# Patient Record
Sex: Female | Born: 1935 | Race: White | Hispanic: No | Smoking: Former smoker
Health system: Southern US, Community
[De-identification: ages and names within clinical notes are randomized; demographics above are authoritative.]

## PROBLEM LIST (undated history)

## (undated) DIAGNOSIS — F0280 Dementia in other diseases classified elsewhere without behavioral disturbance: Secondary | ICD-10-CM

## (undated) DIAGNOSIS — M545 Low back pain, unspecified: Secondary | ICD-10-CM

## (undated) DIAGNOSIS — E785 Hyperlipidemia, unspecified: Secondary | ICD-10-CM

## (undated) DIAGNOSIS — F329 Major depressive disorder, single episode, unspecified: Secondary | ICD-10-CM

## (undated) DIAGNOSIS — E7800 Pure hypercholesterolemia, unspecified: Secondary | ICD-10-CM

## (undated) DIAGNOSIS — G4733 Obstructive sleep apnea (adult) (pediatric): Secondary | ICD-10-CM

## (undated) DIAGNOSIS — R42 Dizziness and giddiness: Secondary | ICD-10-CM

## (undated) DIAGNOSIS — R001 Bradycardia, unspecified: Secondary | ICD-10-CM

## (undated) DIAGNOSIS — F3289 Other specified depressive episodes: Secondary | ICD-10-CM

## (undated) DIAGNOSIS — M4800 Spinal stenosis, site unspecified: Secondary | ICD-10-CM

## (undated) DIAGNOSIS — C50919 Malignant neoplasm of unspecified site of unspecified female breast: Secondary | ICD-10-CM

## (undated) DIAGNOSIS — M129 Arthropathy, unspecified: Secondary | ICD-10-CM

## (undated) DIAGNOSIS — G2581 Restless legs syndrome: Secondary | ICD-10-CM

## (undated) DIAGNOSIS — M792 Neuralgia and neuritis, unspecified: Secondary | ICD-10-CM

## (undated) DIAGNOSIS — J342 Deviated nasal septum: Secondary | ICD-10-CM

## (undated) DIAGNOSIS — G309 Alzheimer's disease, unspecified: Secondary | ICD-10-CM

## (undated) DIAGNOSIS — I10 Essential (primary) hypertension: Secondary | ICD-10-CM

## (undated) HISTORY — DX: Other specified depressive episodes: F32.89

## (undated) HISTORY — DX: Deviated nasal septum: J34.2

## (undated) HISTORY — DX: Malignant neoplasm of unspecified site of unspecified female breast: C50.919

## (undated) HISTORY — DX: Dizziness and giddiness: R42

## (undated) HISTORY — DX: Pure hypercholesterolemia, unspecified: E78.00

## (undated) HISTORY — PX: CHOLECYSTECTOMY: SHX55

## (undated) HISTORY — DX: Obstructive sleep apnea (adult) (pediatric): G47.33

## (undated) HISTORY — PX: BREAST BIOPSY: SHX20

## (undated) HISTORY — PX: KNEE SURGERY: SHX244

## (undated) HISTORY — DX: Bradycardia, unspecified: R00.1

## (undated) HISTORY — DX: Spinal stenosis, site unspecified: M48.00

## (undated) HISTORY — DX: Hyperlipidemia, unspecified: E78.5

## (undated) HISTORY — DX: Major depressive disorder, single episode, unspecified: F32.9

---
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
## 1961-11-26 HISTORY — PX: APPENDECTOMY: SHX54

## 1979-11-27 HISTORY — PX: ABDOMINAL HYSTERECTOMY: SHX81

## 1995-11-27 HISTORY — PX: BREAST LUMPECTOMY: SHX2

## 1996-11-26 HISTORY — PX: CARPAL TUNNEL RELEASE: SHX101

## 2000-11-26 HISTORY — PX: TOTAL HIP ARTHROPLASTY: SHX124

## 2000-11-26 HISTORY — PX: PARTIAL COLECTOMY: SHX5273

## 2006-04-26 HISTORY — PX: TOTAL HIP ARTHROPLASTY: SHX124

## 2007-11-27 HISTORY — PX: PACEMAKER INSERTION: SHX728

## 2009-03-22 HISTORY — PX: COLONOSCOPY: SHX174

## 2010-01-24 HISTORY — PX: OPEN REDUCTION NASAL FRACTURE: SHX2105

## 2012-01-17 DIAGNOSIS — C50919 Malignant neoplasm of unspecified site of unspecified female breast: Secondary | ICD-10-CM | POA: Diagnosis not present

## 2012-01-17 DIAGNOSIS — E7800 Pure hypercholesterolemia, unspecified: Secondary | ICD-10-CM | POA: Diagnosis not present

## 2012-01-17 DIAGNOSIS — R300 Dysuria: Secondary | ICD-10-CM | POA: Diagnosis not present

## 2012-01-17 DIAGNOSIS — E559 Vitamin D deficiency, unspecified: Secondary | ICD-10-CM | POA: Diagnosis not present

## 2012-01-17 DIAGNOSIS — I10 Essential (primary) hypertension: Secondary | ICD-10-CM | POA: Diagnosis not present

## 2012-01-17 DIAGNOSIS — R319 Hematuria, unspecified: Secondary | ICD-10-CM | POA: Diagnosis not present

## 2012-01-17 DIAGNOSIS — M810 Age-related osteoporosis without current pathological fracture: Secondary | ICD-10-CM | POA: Diagnosis not present

## 2012-02-18 DIAGNOSIS — J309 Allergic rhinitis, unspecified: Secondary | ICD-10-CM | POA: Diagnosis not present

## 2012-05-15 DIAGNOSIS — R233 Spontaneous ecchymoses: Secondary | ICD-10-CM | POA: Diagnosis not present

## 2012-05-15 DIAGNOSIS — I10 Essential (primary) hypertension: Secondary | ICD-10-CM | POA: Diagnosis not present

## 2012-05-24 DIAGNOSIS — R42 Dizziness and giddiness: Secondary | ICD-10-CM | POA: Diagnosis not present

## 2012-09-03 DIAGNOSIS — E7800 Pure hypercholesterolemia, unspecified: Secondary | ICD-10-CM | POA: Insufficient documentation

## 2012-09-03 DIAGNOSIS — C50919 Malignant neoplasm of unspecified site of unspecified female breast: Secondary | ICD-10-CM | POA: Insufficient documentation

## 2012-09-10 DIAGNOSIS — IMO0002 Reserved for concepts with insufficient information to code with codable children: Secondary | ICD-10-CM | POA: Diagnosis not present

## 2012-09-10 DIAGNOSIS — R413 Other amnesia: Secondary | ICD-10-CM | POA: Diagnosis not present

## 2012-09-10 DIAGNOSIS — G4733 Obstructive sleep apnea (adult) (pediatric): Secondary | ICD-10-CM | POA: Diagnosis not present

## 2012-09-10 DIAGNOSIS — R42 Dizziness and giddiness: Secondary | ICD-10-CM | POA: Insufficient documentation

## 2012-09-10 DIAGNOSIS — I10 Essential (primary) hypertension: Secondary | ICD-10-CM | POA: Diagnosis not present

## 2012-09-10 DIAGNOSIS — E785 Hyperlipidemia, unspecified: Secondary | ICD-10-CM | POA: Diagnosis not present

## 2012-09-29 DIAGNOSIS — R413 Other amnesia: Secondary | ICD-10-CM | POA: Diagnosis not present

## 2012-09-29 DIAGNOSIS — R42 Dizziness and giddiness: Secondary | ICD-10-CM | POA: Diagnosis not present

## 2012-09-29 DIAGNOSIS — H81399 Other peripheral vertigo, unspecified ear: Secondary | ICD-10-CM | POA: Diagnosis not present

## 2012-09-29 DIAGNOSIS — R269 Unspecified abnormalities of gait and mobility: Secondary | ICD-10-CM | POA: Diagnosis not present

## 2012-09-29 DIAGNOSIS — R41841 Cognitive communication deficit: Secondary | ICD-10-CM | POA: Diagnosis not present

## 2012-09-29 DIAGNOSIS — R279 Unspecified lack of coordination: Secondary | ICD-10-CM | POA: Diagnosis not present

## 2012-09-30 DIAGNOSIS — R413 Other amnesia: Secondary | ICD-10-CM | POA: Diagnosis not present

## 2012-09-30 DIAGNOSIS — R41841 Cognitive communication deficit: Secondary | ICD-10-CM | POA: Diagnosis not present

## 2012-09-30 DIAGNOSIS — H81399 Other peripheral vertigo, unspecified ear: Secondary | ICD-10-CM | POA: Diagnosis not present

## 2012-09-30 DIAGNOSIS — R42 Dizziness and giddiness: Secondary | ICD-10-CM | POA: Diagnosis not present

## 2012-09-30 DIAGNOSIS — R279 Unspecified lack of coordination: Secondary | ICD-10-CM | POA: Diagnosis not present

## 2012-09-30 DIAGNOSIS — R269 Unspecified abnormalities of gait and mobility: Secondary | ICD-10-CM | POA: Diagnosis not present

## 2012-10-01 DIAGNOSIS — H81399 Other peripheral vertigo, unspecified ear: Secondary | ICD-10-CM | POA: Diagnosis not present

## 2012-10-01 DIAGNOSIS — R41841 Cognitive communication deficit: Secondary | ICD-10-CM | POA: Diagnosis not present

## 2012-10-01 DIAGNOSIS — R42 Dizziness and giddiness: Secondary | ICD-10-CM | POA: Diagnosis not present

## 2012-10-01 DIAGNOSIS — R279 Unspecified lack of coordination: Secondary | ICD-10-CM | POA: Diagnosis not present

## 2012-10-01 DIAGNOSIS — R413 Other amnesia: Secondary | ICD-10-CM | POA: Diagnosis not present

## 2012-10-01 DIAGNOSIS — R269 Unspecified abnormalities of gait and mobility: Secondary | ICD-10-CM | POA: Diagnosis not present

## 2012-10-06 DIAGNOSIS — R413 Other amnesia: Secondary | ICD-10-CM | POA: Diagnosis not present

## 2012-10-06 DIAGNOSIS — R42 Dizziness and giddiness: Secondary | ICD-10-CM | POA: Diagnosis not present

## 2012-10-06 DIAGNOSIS — R269 Unspecified abnormalities of gait and mobility: Secondary | ICD-10-CM | POA: Diagnosis not present

## 2012-10-06 DIAGNOSIS — R41841 Cognitive communication deficit: Secondary | ICD-10-CM | POA: Diagnosis not present

## 2012-10-06 DIAGNOSIS — R279 Unspecified lack of coordination: Secondary | ICD-10-CM | POA: Diagnosis not present

## 2012-10-06 DIAGNOSIS — H81399 Other peripheral vertigo, unspecified ear: Secondary | ICD-10-CM | POA: Diagnosis not present

## 2012-10-07 DIAGNOSIS — M79609 Pain in unspecified limb: Secondary | ICD-10-CM | POA: Diagnosis not present

## 2012-10-07 DIAGNOSIS — B351 Tinea unguium: Secondary | ICD-10-CM | POA: Diagnosis not present

## 2012-10-07 DIAGNOSIS — M2010 Hallux valgus (acquired), unspecified foot: Secondary | ICD-10-CM | POA: Diagnosis not present

## 2012-10-07 DIAGNOSIS — L851 Acquired keratosis [keratoderma] palmaris et plantaris: Secondary | ICD-10-CM | POA: Diagnosis not present

## 2012-10-08 DIAGNOSIS — R413 Other amnesia: Secondary | ICD-10-CM | POA: Diagnosis not present

## 2012-10-08 DIAGNOSIS — R279 Unspecified lack of coordination: Secondary | ICD-10-CM | POA: Diagnosis not present

## 2012-10-08 DIAGNOSIS — R41841 Cognitive communication deficit: Secondary | ICD-10-CM | POA: Diagnosis not present

## 2012-10-08 DIAGNOSIS — Z23 Encounter for immunization: Secondary | ICD-10-CM | POA: Diagnosis not present

## 2012-10-08 DIAGNOSIS — R42 Dizziness and giddiness: Secondary | ICD-10-CM | POA: Diagnosis not present

## 2012-10-08 DIAGNOSIS — H81399 Other peripheral vertigo, unspecified ear: Secondary | ICD-10-CM | POA: Diagnosis not present

## 2012-10-08 DIAGNOSIS — R269 Unspecified abnormalities of gait and mobility: Secondary | ICD-10-CM | POA: Diagnosis not present

## 2012-10-09 DIAGNOSIS — H81399 Other peripheral vertigo, unspecified ear: Secondary | ICD-10-CM | POA: Diagnosis not present

## 2012-10-09 DIAGNOSIS — R41841 Cognitive communication deficit: Secondary | ICD-10-CM | POA: Diagnosis not present

## 2012-10-09 DIAGNOSIS — R42 Dizziness and giddiness: Secondary | ICD-10-CM | POA: Diagnosis not present

## 2012-10-09 DIAGNOSIS — R413 Other amnesia: Secondary | ICD-10-CM | POA: Diagnosis not present

## 2012-10-09 DIAGNOSIS — R279 Unspecified lack of coordination: Secondary | ICD-10-CM | POA: Diagnosis not present

## 2012-10-09 DIAGNOSIS — R269 Unspecified abnormalities of gait and mobility: Secondary | ICD-10-CM | POA: Diagnosis not present

## 2012-10-13 DIAGNOSIS — R29898 Other symptoms and signs involving the musculoskeletal system: Secondary | ICD-10-CM | POA: Diagnosis not present

## 2012-10-13 DIAGNOSIS — R279 Unspecified lack of coordination: Secondary | ICD-10-CM | POA: Diagnosis not present

## 2012-10-13 DIAGNOSIS — R269 Unspecified abnormalities of gait and mobility: Secondary | ICD-10-CM | POA: Diagnosis not present

## 2012-10-13 DIAGNOSIS — M4800 Spinal stenosis, site unspecified: Secondary | ICD-10-CM | POA: Diagnosis not present

## 2012-10-13 DIAGNOSIS — R42 Dizziness and giddiness: Secondary | ICD-10-CM | POA: Diagnosis not present

## 2012-10-13 DIAGNOSIS — G319 Degenerative disease of nervous system, unspecified: Secondary | ICD-10-CM | POA: Diagnosis not present

## 2012-10-13 DIAGNOSIS — R413 Other amnesia: Secondary | ICD-10-CM | POA: Diagnosis not present

## 2012-10-13 DIAGNOSIS — R41841 Cognitive communication deficit: Secondary | ICD-10-CM | POA: Diagnosis not present

## 2012-10-13 DIAGNOSIS — M545 Low back pain: Secondary | ICD-10-CM | POA: Diagnosis not present

## 2012-10-13 DIAGNOSIS — H81399 Other peripheral vertigo, unspecified ear: Secondary | ICD-10-CM | POA: Diagnosis not present

## 2012-10-14 DIAGNOSIS — R42 Dizziness and giddiness: Secondary | ICD-10-CM | POA: Diagnosis not present

## 2012-10-14 DIAGNOSIS — R413 Other amnesia: Secondary | ICD-10-CM | POA: Diagnosis not present

## 2012-10-14 DIAGNOSIS — H81399 Other peripheral vertigo, unspecified ear: Secondary | ICD-10-CM | POA: Diagnosis not present

## 2012-10-14 DIAGNOSIS — R279 Unspecified lack of coordination: Secondary | ICD-10-CM | POA: Diagnosis not present

## 2012-10-14 DIAGNOSIS — R269 Unspecified abnormalities of gait and mobility: Secondary | ICD-10-CM | POA: Diagnosis not present

## 2012-10-14 DIAGNOSIS — R41841 Cognitive communication deficit: Secondary | ICD-10-CM | POA: Diagnosis not present

## 2012-10-15 DIAGNOSIS — R41841 Cognitive communication deficit: Secondary | ICD-10-CM | POA: Diagnosis not present

## 2012-10-15 DIAGNOSIS — R269 Unspecified abnormalities of gait and mobility: Secondary | ICD-10-CM | POA: Diagnosis not present

## 2012-10-15 DIAGNOSIS — H81399 Other peripheral vertigo, unspecified ear: Secondary | ICD-10-CM | POA: Diagnosis not present

## 2012-10-15 DIAGNOSIS — R279 Unspecified lack of coordination: Secondary | ICD-10-CM | POA: Diagnosis not present

## 2012-10-15 DIAGNOSIS — R413 Other amnesia: Secondary | ICD-10-CM | POA: Diagnosis not present

## 2012-10-15 DIAGNOSIS — R42 Dizziness and giddiness: Secondary | ICD-10-CM | POA: Diagnosis not present

## 2012-10-17 DIAGNOSIS — R413 Other amnesia: Secondary | ICD-10-CM | POA: Diagnosis not present

## 2012-10-17 DIAGNOSIS — R269 Unspecified abnormalities of gait and mobility: Secondary | ICD-10-CM | POA: Diagnosis not present

## 2012-10-17 DIAGNOSIS — R279 Unspecified lack of coordination: Secondary | ICD-10-CM | POA: Diagnosis not present

## 2012-10-17 DIAGNOSIS — R42 Dizziness and giddiness: Secondary | ICD-10-CM | POA: Diagnosis not present

## 2012-10-17 DIAGNOSIS — H81399 Other peripheral vertigo, unspecified ear: Secondary | ICD-10-CM | POA: Diagnosis not present

## 2012-10-17 DIAGNOSIS — R41841 Cognitive communication deficit: Secondary | ICD-10-CM | POA: Diagnosis not present

## 2012-10-19 DIAGNOSIS — R269 Unspecified abnormalities of gait and mobility: Secondary | ICD-10-CM | POA: Diagnosis not present

## 2012-10-19 DIAGNOSIS — R413 Other amnesia: Secondary | ICD-10-CM | POA: Diagnosis not present

## 2012-10-19 DIAGNOSIS — R42 Dizziness and giddiness: Secondary | ICD-10-CM | POA: Diagnosis not present

## 2012-10-19 DIAGNOSIS — H81399 Other peripheral vertigo, unspecified ear: Secondary | ICD-10-CM | POA: Diagnosis not present

## 2012-10-19 DIAGNOSIS — R41841 Cognitive communication deficit: Secondary | ICD-10-CM | POA: Diagnosis not present

## 2012-10-19 DIAGNOSIS — R279 Unspecified lack of coordination: Secondary | ICD-10-CM | POA: Diagnosis not present

## 2012-10-21 DIAGNOSIS — R42 Dizziness and giddiness: Secondary | ICD-10-CM | POA: Diagnosis not present

## 2012-10-21 DIAGNOSIS — R41841 Cognitive communication deficit: Secondary | ICD-10-CM | POA: Diagnosis not present

## 2012-10-21 DIAGNOSIS — R413 Other amnesia: Secondary | ICD-10-CM | POA: Diagnosis not present

## 2012-10-21 DIAGNOSIS — H81399 Other peripheral vertigo, unspecified ear: Secondary | ICD-10-CM | POA: Diagnosis not present

## 2012-10-21 DIAGNOSIS — R269 Unspecified abnormalities of gait and mobility: Secondary | ICD-10-CM | POA: Diagnosis not present

## 2012-10-21 DIAGNOSIS — R279 Unspecified lack of coordination: Secondary | ICD-10-CM | POA: Diagnosis not present

## 2012-10-27 DIAGNOSIS — R413 Other amnesia: Secondary | ICD-10-CM | POA: Diagnosis not present

## 2012-10-27 DIAGNOSIS — H81399 Other peripheral vertigo, unspecified ear: Secondary | ICD-10-CM | POA: Diagnosis not present

## 2012-10-27 DIAGNOSIS — R42 Dizziness and giddiness: Secondary | ICD-10-CM | POA: Diagnosis not present

## 2012-10-27 DIAGNOSIS — R279 Unspecified lack of coordination: Secondary | ICD-10-CM | POA: Diagnosis not present

## 2012-10-27 DIAGNOSIS — R41841 Cognitive communication deficit: Secondary | ICD-10-CM | POA: Diagnosis not present

## 2012-10-27 DIAGNOSIS — R269 Unspecified abnormalities of gait and mobility: Secondary | ICD-10-CM | POA: Diagnosis not present

## 2012-10-28 DIAGNOSIS — R41841 Cognitive communication deficit: Secondary | ICD-10-CM | POA: Diagnosis not present

## 2012-10-28 DIAGNOSIS — R413 Other amnesia: Secondary | ICD-10-CM | POA: Diagnosis not present

## 2012-10-28 DIAGNOSIS — R42 Dizziness and giddiness: Secondary | ICD-10-CM | POA: Diagnosis not present

## 2012-10-28 DIAGNOSIS — R279 Unspecified lack of coordination: Secondary | ICD-10-CM | POA: Diagnosis not present

## 2012-10-28 DIAGNOSIS — R269 Unspecified abnormalities of gait and mobility: Secondary | ICD-10-CM | POA: Diagnosis not present

## 2012-10-28 DIAGNOSIS — H81399 Other peripheral vertigo, unspecified ear: Secondary | ICD-10-CM | POA: Diagnosis not present

## 2012-10-31 DIAGNOSIS — R41841 Cognitive communication deficit: Secondary | ICD-10-CM | POA: Diagnosis not present

## 2012-10-31 DIAGNOSIS — R413 Other amnesia: Secondary | ICD-10-CM | POA: Diagnosis not present

## 2012-10-31 DIAGNOSIS — R42 Dizziness and giddiness: Secondary | ICD-10-CM | POA: Diagnosis not present

## 2012-10-31 DIAGNOSIS — H81399 Other peripheral vertigo, unspecified ear: Secondary | ICD-10-CM | POA: Diagnosis not present

## 2012-10-31 DIAGNOSIS — R279 Unspecified lack of coordination: Secondary | ICD-10-CM | POA: Diagnosis not present

## 2012-10-31 DIAGNOSIS — R269 Unspecified abnormalities of gait and mobility: Secondary | ICD-10-CM | POA: Diagnosis not present

## 2012-11-03 DIAGNOSIS — R42 Dizziness and giddiness: Secondary | ICD-10-CM | POA: Diagnosis not present

## 2012-11-03 DIAGNOSIS — R269 Unspecified abnormalities of gait and mobility: Secondary | ICD-10-CM | POA: Diagnosis not present

## 2012-11-03 DIAGNOSIS — R41841 Cognitive communication deficit: Secondary | ICD-10-CM | POA: Diagnosis not present

## 2012-11-03 DIAGNOSIS — H81399 Other peripheral vertigo, unspecified ear: Secondary | ICD-10-CM | POA: Diagnosis not present

## 2012-11-03 DIAGNOSIS — R279 Unspecified lack of coordination: Secondary | ICD-10-CM | POA: Diagnosis not present

## 2012-11-03 DIAGNOSIS — R413 Other amnesia: Secondary | ICD-10-CM | POA: Diagnosis not present

## 2012-11-04 DIAGNOSIS — R269 Unspecified abnormalities of gait and mobility: Secondary | ICD-10-CM | POA: Diagnosis not present

## 2012-11-04 DIAGNOSIS — H81399 Other peripheral vertigo, unspecified ear: Secondary | ICD-10-CM | POA: Diagnosis not present

## 2012-11-04 DIAGNOSIS — R42 Dizziness and giddiness: Secondary | ICD-10-CM | POA: Diagnosis not present

## 2012-11-04 DIAGNOSIS — R413 Other amnesia: Secondary | ICD-10-CM | POA: Diagnosis not present

## 2012-11-04 DIAGNOSIS — R41841 Cognitive communication deficit: Secondary | ICD-10-CM | POA: Diagnosis not present

## 2012-11-04 DIAGNOSIS — R279 Unspecified lack of coordination: Secondary | ICD-10-CM | POA: Diagnosis not present

## 2012-11-05 DIAGNOSIS — R42 Dizziness and giddiness: Secondary | ICD-10-CM | POA: Diagnosis not present

## 2012-11-05 DIAGNOSIS — R413 Other amnesia: Secondary | ICD-10-CM | POA: Diagnosis not present

## 2012-11-05 DIAGNOSIS — H81399 Other peripheral vertigo, unspecified ear: Secondary | ICD-10-CM | POA: Diagnosis not present

## 2012-11-05 DIAGNOSIS — R279 Unspecified lack of coordination: Secondary | ICD-10-CM | POA: Diagnosis not present

## 2012-11-05 DIAGNOSIS — R269 Unspecified abnormalities of gait and mobility: Secondary | ICD-10-CM | POA: Diagnosis not present

## 2012-11-05 DIAGNOSIS — R41841 Cognitive communication deficit: Secondary | ICD-10-CM | POA: Diagnosis not present

## 2012-12-18 ENCOUNTER — Emergency Department (HOSPITAL_COMMUNITY)
Admission: EM | Admit: 2012-12-18 | Discharge: 2012-12-19 | Disposition: A | Discharge status: Home Health | Payer: Medicare Other | Attending: Emergency Medicine | Admitting: Emergency Medicine

## 2012-12-18 ENCOUNTER — Encounter (HOSPITAL_COMMUNITY): Payer: Self-pay | Admitting: Emergency Medicine

## 2012-12-18 DIAGNOSIS — F0280 Dementia in other diseases classified elsewhere without behavioral disturbance: Secondary | ICD-10-CM | POA: Diagnosis not present

## 2012-12-18 DIAGNOSIS — R42 Dizziness and giddiness: Secondary | ICD-10-CM | POA: Insufficient documentation

## 2012-12-18 DIAGNOSIS — R209 Unspecified disturbances of skin sensation: Secondary | ICD-10-CM | POA: Insufficient documentation

## 2012-12-18 DIAGNOSIS — I10 Essential (primary) hypertension: Secondary | ICD-10-CM | POA: Diagnosis not present

## 2012-12-18 DIAGNOSIS — R55 Syncope and collapse: Secondary | ICD-10-CM | POA: Diagnosis not present

## 2012-12-18 DIAGNOSIS — Z8709 Personal history of other diseases of the respiratory system: Secondary | ICD-10-CM | POA: Diagnosis not present

## 2012-12-18 DIAGNOSIS — J9819 Other pulmonary collapse: Secondary | ICD-10-CM | POA: Diagnosis not present

## 2012-12-18 DIAGNOSIS — Z8739 Personal history of other diseases of the musculoskeletal system and connective tissue: Secondary | ICD-10-CM | POA: Insufficient documentation

## 2012-12-18 DIAGNOSIS — Z87440 Personal history of urinary (tract) infections: Secondary | ICD-10-CM | POA: Diagnosis not present

## 2012-12-18 DIAGNOSIS — G309 Alzheimer's disease, unspecified: Secondary | ICD-10-CM | POA: Insufficient documentation

## 2012-12-18 DIAGNOSIS — Z8669 Personal history of other diseases of the nervous system and sense organs: Secondary | ICD-10-CM | POA: Insufficient documentation

## 2012-12-18 DIAGNOSIS — R6889 Other general symptoms and signs: Secondary | ICD-10-CM | POA: Diagnosis not present

## 2012-12-18 HISTORY — DX: Low back pain, unspecified: M54.50

## 2012-12-18 HISTORY — DX: Restless legs syndrome: G25.81

## 2012-12-18 HISTORY — DX: Low back pain: M54.5

## 2012-12-18 HISTORY — DX: Neuralgia and neuritis, unspecified: M79.2

## 2012-12-18 HISTORY — DX: Dementia in other diseases classified elsewhere, unspecified severity, without behavioral disturbance, psychotic disturbance, mood disturbance, and anxiety: F02.80

## 2012-12-18 HISTORY — DX: Essential (primary) hypertension: I10

## 2012-12-18 HISTORY — DX: Arthropathy, unspecified: M12.9

## 2012-12-18 HISTORY — DX: Alzheimer's disease, unspecified: G30.9

## 2012-12-18 LAB — CBC
HCT: 41 % (ref 36.0–46.0)
MCHC: 35.1 g/dL (ref 30.0–36.0)
Platelets: 190 10*3/uL (ref 150–400)
RDW: 13.2 % (ref 11.5–15.5)
WBC: 9.9 10*3/uL (ref 4.0–10.5)

## 2012-12-18 LAB — POCT I-STAT, CHEM 8
Chloride: 102 mEq/L (ref 96–112)
Glucose, Bld: 134 mg/dL — ABNORMAL HIGH (ref 70–99)
HCT: 41 % (ref 36.0–46.0)
Hemoglobin: 13.9 g/dL (ref 12.0–15.0)
Potassium: 3.5 mEq/L (ref 3.5–5.1)

## 2012-12-18 MED ORDER — SODIUM CHLORIDE 0.9 % IV SOLN
INTRAVENOUS | Status: DC
  Administered 2012-12-19: 01:00:00 via INTRAVENOUS

## 2012-12-18 NOTE — ED Notes (Signed)
Pt has some c/o dizziness while the room was spinning. Pt also had some feelings of palpitations. Pt denies any CP or SOB. Moves all ext. A/O per her normal.

## 2012-12-18 NOTE — ED Provider Notes (Signed)
History     CSN: [id number]  Arrival date & time 12/18/12  2322   First MD Initiated Contact with Patient 12/18/12 2333      Chief Complaint  Patient presents with  . Palpitations    (Consider location/radiation/quality/duration/timing/severity/associated sxs/prior treatment) HPI HX per PT, has pacemaker placed by [facility] in [place] about 7 years ago .  She believes that she has a problem with it - c/o intermittent palpitations with near syncope. Tonight went to get up, room started spinning, laid down and was able to push her call button for help.  She asked for 911. She never passed out and near syncope lasted about 2 minutes.  No h/o same. Resides in ALF.  No local cardiologist. PT unable to say what kind of pacemaker she has. No CP or SOB. Palpitations last seconds. No F/C or recent illness. PT unable to say why she has a pacemaker Past Medical History  Diagnosis Date  . Alzheimer disease   . Neuritis   . Lumbosacral pain   . Restless leg syndrome   . Hypertension   . Bronchitis   . UTI (urinary tract infection)   . Arthropathy     No past surgical history on file.  No family history on file.  History  Substance Use Topics  . Smoking status: Never Smoker   . Smokeless tobacco: Never Used  . Alcohol Use: No    OB History    Grav Para Term Preterm Abortions TAB SAB Ect Mult Living                  Review of Systems  Constitutional: Negative for fever and chills.  HENT: Negative for neck pain and neck stiffness.   Eyes: Negative for pain.  Respiratory: Negative for shortness of breath.   Cardiovascular: Positive for palpitations. Negative for chest pain.  Gastrointestinal: Negative for abdominal pain.  Genitourinary: Negative for dysuria.  Musculoskeletal: Negative for back pain.  Skin: Negative for rash.  Neurological: Positive for light-headedness. Negative for syncope, weakness and headaches.  All other systems reviewed and are negative.    Allergies   Demerol; Ketorolac; Percocet; and Toradol  Home Medications  No current outpatient prescriptions on file.  BP 122/66  Pulse 76  Temp 98.3 F (36.8 C) (Oral)  Resp 20  SpO2 97%  Physical Exam  Constitutional: She is oriented to person, place, and time. She appears well-developed and well-nourished.  HENT:  Head: Normocephalic and atraumatic.  Eyes: Conjunctivae normal and EOM are normal. Pupils are equal, round, and reactive to light.  Neck: Trachea normal. Neck supple. No thyromegaly present.  Cardiovascular: Normal rate, regular rhythm, S1 normal, S2 normal and normal pulses.     No systolic murmur is present   No diastolic murmur is present  Pulses:      Radial pulses are 2+ on the right side, and 2+ on the left side.  Pulmonary/Chest: Effort normal and breath sounds normal. She has no wheezes. She has no rhonchi. She has no rales. She exhibits no tenderness.  Abdominal: Soft. Normal appearance and bowel sounds are normal. There is no tenderness. There is no CVA tenderness and negative Murphy's sign.  Musculoskeletal:       BLE:s Calves nontender, no cords or erythema, negative Homans sign  Neurological: She is alert and oriented to person, place, and time. She has normal strength. No cranial nerve deficit or sensory deficit. GCS eye subscore is 4. GCS verbal subscore is 5. GCS motor  subscore is 6.  Skin: Skin is warm and dry. No rash noted. She is not diaphoretic.  Psychiatric: Her speech is normal.       Cooperative and appropriate    ED Course  Procedures (including critical care time)  Results for orders placed during the hospital encounter of 12/18/12  CBC      Component Value Range   WBC 9.9  4.0 - 10.5 K/uL   RBC 4.34  3.87 - 5.11 MIL/uL   Hemoglobin 14.4  12.0 - 15.0 g/dL   HCT 72.5  36.6 - 44.0 %   MCV 94.5  78.0 - 100.0 fL   MCH 33.2  26.0 - 34.0 pg   MCHC 35.1  30.0 - 36.0 g/dL   RDW 34.7  42.5 - 95.6 %   Platelets 190  150 - 400 K/uL  POCT I-STAT, CHEM  8      Component Value Range   Sodium 140  135 - 145 mEq/L   Potassium 3.5  3.5 - 5.1 mEq/L   Chloride 102  96 - 112 mEq/L   BUN 26 (*) 6 - 23 mg/dL   Creatinine, Ser 3.87  0.50 - 1.10 mg/dL   Glucose, Bld 564 (*) 70 - 99 mg/dL   Calcium, Ion 3.32  9.51 - 1.30 mmol/L   TCO2 29  0 - 100 mmol/L   Hemoglobin 13.9  12.0 - 15.0 g/dL   HCT 88.4  16.6 - 06.3 %  POCT I-STAT TROPONIN I      Component Value Range   Troponin i, poc 0.00  0.00 - 0.08 ng/mL   Comment 3            Dg Chest Portable 1 View  12/19/2012  *RADIOLOGY REPORT*  Clinical Data: Palpitations; near-syncope and dizziness.  PORTABLE CHEST - 1 VIEW  Comparison: None.  Findings: The lungs are well-aerated.  Mild vascular congestion is noted.  Mild left basilar atelectasis is seen.  There is no evidence of pleural effusion or pneumothorax.  The cardiomediastinal silhouette is borderline enlarged.  A pacemaker is noted overlying the left chest wall, with leads ending overlying the right atrium and right ventricle.  Calcification is noted within the aortic arch.  No acute osseous abnormalities are seen.  IMPRESSION: Mild vascular congestion and borderline cardiomegaly; mild left basilar atelectasis seen.  Lungs otherwise clear.   Original Report Authenticated By: Tonia Ghent, M.D.      Date: 12/18/2012  Rate: 77  Rhythm: normal sinus rhythm  QRS Axis: normal  Intervals: normal  ST/T Wave abnormalities: nonspecific ST changes  Conduction Disutrbances:nonspecific intraventricular conduction delay  Narrative Interpretation:   Old EKG Reviewed: none available  [employer] rep interrogated pacemaker, no abnormalities or arrhythmia identified. On recheck PT ambulates NAD, no further symptoms. She declines admit and wishes to be discharged home.  She agrees to call her physician today for recheck and return here for any return of symptoms or new or worsening condition  MDM  Near Syncope with pacemaker and brief palpitations. No  arrhythmia identified. No return of symptoms. Evaluated with ECG, labs, CXR and pacer interrogation as above. VS and nursing notes reviewed. Serial exams, PT declines admit.         Sunnie Nielsen, MD 12/19/12 [phone]

## 2012-12-19 ENCOUNTER — Emergency Department (HOSPITAL_COMMUNITY): Payer: Medicare Other

## 2012-12-19 DIAGNOSIS — J9819 Other pulmonary collapse: Secondary | ICD-10-CM | POA: Diagnosis not present

## 2012-12-19 DIAGNOSIS — R42 Dizziness and giddiness: Secondary | ICD-10-CM | POA: Diagnosis not present

## 2012-12-19 DIAGNOSIS — I10 Essential (primary) hypertension: Secondary | ICD-10-CM | POA: Diagnosis not present

## 2012-12-19 LAB — POCT I-STAT TROPONIN I: Troponin i, poc: 0 ng/mL (ref 0.00–0.08)

## 2012-12-19 NOTE — ED Notes (Signed)
Patient ambulated to restroom with assist.  Patient tolerated well.

## 2012-12-19 NOTE — ED Notes (Signed)
[employer] and spoke with Debbie.  She advised would send someone to come pick up patient.

## 2013-01-07 DIAGNOSIS — M4800 Spinal stenosis, site unspecified: Secondary | ICD-10-CM | POA: Diagnosis not present

## 2013-01-07 DIAGNOSIS — I10 Essential (primary) hypertension: Secondary | ICD-10-CM | POA: Diagnosis not present

## 2013-01-07 DIAGNOSIS — G319 Degenerative disease of nervous system, unspecified: Secondary | ICD-10-CM | POA: Diagnosis not present

## 2013-01-12 ENCOUNTER — Ambulatory Visit (INDEPENDENT_AMBULATORY_CARE_PROVIDER_SITE_OTHER): Payer: Medicare Other | Admitting: Internal Medicine

## 2013-01-12 VITALS — BP 123/72 | HR 84 | Ht 61.0 in | Wt 185.0 lb

## 2013-01-12 DIAGNOSIS — R001 Bradycardia, unspecified: Secondary | ICD-10-CM

## 2013-01-12 DIAGNOSIS — I498 Other specified cardiac arrhythmias: Secondary | ICD-10-CM | POA: Diagnosis not present

## 2013-01-12 DIAGNOSIS — I10 Essential (primary) hypertension: Secondary | ICD-10-CM

## 2013-01-12 LAB — PACEMAKER DEVICE OBSERVATION
AL AMPLITUDE: 2.3 mv
ATRIAL PACING PM: 3
DEVICE MODEL PM: 588589
RV LEAD AMPLITUDE: 12 mv
RV LEAD IMPEDENCE PM: 790 Ohm
RV LEAD THRESHOLD: 0.6 V
VENTRICULAR PACING PM: 0

## 2013-01-12 NOTE — Patient Instructions (Addendum)
Your physician wants you to follow-up in: 6 months in the device clinic and 12 months with Dr Allred You will receive a reminder letter in the mail two months in advance. If you don't receive a letter, please call our office to schedule the follow-up appointment.  

## 2013-01-18 ENCOUNTER — Encounter: Payer: Self-pay | Admitting: Internal Medicine

## 2013-01-18 DIAGNOSIS — R001 Bradycardia, unspecified: Secondary | ICD-10-CM | POA: Insufficient documentation

## 2013-01-18 DIAGNOSIS — I10 Essential (primary) hypertension: Secondary | ICD-10-CM | POA: Insufficient documentation

## 2013-01-18 NOTE — Progress Notes (Signed)
GREEN, Lenon Curt, MD:  Sydney Arroyo is a 79 y.o. 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   She recently moved to our area.  She has advanced dementia and is unable to provide history today.  The patient reports doing very well since having a pacemaker implanted and remains reasonably active despite her age.   Today, she  denies symptoms of palpitations, chest pain, shortness of breath, orthopnea, PND, lower extremity edema, dizziness, presyncope, syncope, or neurologic sequela.  The patientis tolerating medications without difficulties and is otherwise without complaint today.   Past Medical History  Diagnosis Date  . Alzheimer disease   . Neuritis   . Lumbosacral pain   . Restless leg syndrome   . Hypertension   . Bronchitis   . UTI (urinary tract infection)   . Arthropathy   . Sleep apnea   . Bradycardia    Past Surgical History  Procedure Laterality Date  . Pacemaker insertion      History   Social History  . Marital Status: Widowed    Spouse Name: N/A    Number of Children: N/A  . Years of Education: N/A   Occupational History  . Not on file.   Social History Main Topics  . Smoking status: Never Smoker   . Smokeless tobacco: Never Used  . Alcohol Use: No  . Drug Use: No  . Sexually Active: No   Other Topics Concern  . Not on file   Social History Narrative  . No narrative on file    No family history on file.  Allergies  Allergen Reactions  . Demerol (Meperidine) Nausea And Vomiting  . Ketorolac Other (See Comments)    unknown    Current Outpatient Prescriptions  Medication Sig Dispense Refill  . amLODipine (NORVASC) 5 MG tablet Take 5 mg by mouth daily.      . colesevelam (WELCHOL) 625 MG tablet Take 1,875 mg by mouth daily.      Marland Kitchen donepezil (ARICEPT) 10 MG tablet Take 10 mg by mouth at bedtime as needed.      . fexofenadine (ALLEGRA) 180 MG tablet  Take 180 mg by mouth daily.      . fluticasone (FLONASE) 50 MCG/ACT nasal spray Place 1-2 sprays into the nose daily as needed. For sinus problems      . HYDROcodone-acetaminophen (NORCO/VICODIN) 5-325 MG per tablet Take 1 tablet by mouth every 6 (six) hours as needed. For pain      . losartan-hydrochlorothiazide (HYZAAR) 100-25 MG per tablet Take 1 tablet by mouth daily.      . meclizine (ANTIVERT) 25 MG tablet Take 25 mg by mouth 3 (three) times daily as needed. For vertigo      . metoprolol succinate (TOPROL-XL) 25 MG 24 hr tablet Take 25 mg by mouth daily.      . montelukast (SINGULAIR) 10 MG tablet Take 10 mg by mouth at bedtime.      . naproxen (NAPROSYN) 500 MG tablet Take 500 mg by mouth 2 (two) times daily with a meal.      . rOPINIRole (REQUIP) 0.5 MG tablet Take 0.5 mg by mouth at bedtime.       No current facility-administered medications for this visit.    ROS- all systems are reviewed and negative except as per HPI  Physical Exam: Filed Vitals:   01/12/13 1616  BP: 123/72  Pulse: 84  Height: 5\' 1"  (1.549 m)  Weight: 185 lb (83.915 kg)    GEN- The patient is well appearing, alert and oriented x 3 today.  Head- normocephalic, atraumatic Eyes-  Sclera clear, conjunctiva pink Ears- hearing intact Oropharynx- clear Neck- supple, no JVP Lymph- no cervical lymphadenopathy Lungs- Clear to ausculation bilaterally, normal work of breathing Chest- pacemaker pocket is well healed Heart- Regular rate and rhythm, no murmurs, rubs or gallops, PMI not laterally displaced GI- soft, NT, ND, + BS Extremities- no clubbing, cyanosis, or edema MS- no significant deformity or atrophy Skin- no rash or lesion Psych- euthymic mood, full affect 0/3 short term memory recall Neuro- strength and sensation are intact  Pacemaker interrogation- reviewed in detail today,  See PACEART report  Assessment and Plan:

## 2013-01-18 NOTE — Assessment & Plan Note (Signed)
Normal pacemaker function See Arita Miss Art report No changes today  Return to the device clinic in 6 months and I will see again in a year

## 2013-01-18 NOTE — Assessment & Plan Note (Signed)
Stable No change required today  

## 2013-01-26 ENCOUNTER — Encounter: Payer: Self-pay | Admitting: Internal Medicine

## 2013-02-05 DIAGNOSIS — D485 Neoplasm of uncertain behavior of skin: Secondary | ICD-10-CM | POA: Diagnosis not present

## 2013-02-18 DIAGNOSIS — H2510 Age-related nuclear cataract, unspecified eye: Secondary | ICD-10-CM | POA: Diagnosis not present

## 2013-04-08 ENCOUNTER — Non-Acute Institutional Stay: Payer: Medicare Other | Admitting: Geriatric Medicine

## 2013-04-08 VITALS — BP 128/72 | HR 88 | Ht 61.0 in | Wt 189.0 lb

## 2013-04-08 DIAGNOSIS — F0280 Dementia in other diseases classified elsewhere without behavioral disturbance: Secondary | ICD-10-CM

## 2013-04-08 DIAGNOSIS — M899 Disorder of bone, unspecified: Secondary | ICD-10-CM

## 2013-04-08 DIAGNOSIS — Z13820 Encounter for screening for osteoporosis: Secondary | ICD-10-CM

## 2013-04-08 DIAGNOSIS — M8580 Other specified disorders of bone density and structure, unspecified site: Secondary | ICD-10-CM

## 2013-04-08 DIAGNOSIS — M48061 Spinal stenosis, lumbar region without neurogenic claudication: Secondary | ICD-10-CM | POA: Diagnosis not present

## 2013-04-08 DIAGNOSIS — R42 Dizziness and giddiness: Secondary | ICD-10-CM | POA: Diagnosis not present

## 2013-04-08 DIAGNOSIS — G309 Alzheimer's disease, unspecified: Secondary | ICD-10-CM | POA: Diagnosis not present

## 2013-04-08 DIAGNOSIS — Z0000 Encounter for general adult medical examination without abnormal findings: Secondary | ICD-10-CM

## 2013-04-08 DIAGNOSIS — I10 Essential (primary) hypertension: Secondary | ICD-10-CM

## 2013-04-08 NOTE — Progress Notes (Deleted)
Patient ID: Sydney Arroyo, female   DOB: 1931/05/25, 79 y.o.   MRN: [id number]      No chief complaint on file.   HPI   {Rehab current functional status:30803}  Allergies  Allergies  Allergen Reactions  . Demerol (Meperidine) Nausea And Vomiting  . Ketorolac Other (See Comments)    unknown   Medications *** Data Reviewed    Radiology:   Lab:   Other:  PMH ***  PSH ***  FH ***  SH ***  Review of Systems ***  Physical Exam There were no vitals filed for this visit. There is no weight on file to calculate BMI. *** ASSESSMENT/PLAN ***  Follow up:  Sydney Iovino T.Ivelis Norgard, NP-C 04/08/2013

## 2013-04-09 ENCOUNTER — Encounter: Payer: Self-pay | Admitting: Geriatric Medicine

## 2013-04-09 DIAGNOSIS — Z0000 Encounter for general adult medical examination without abnormal findings: Secondary | ICD-10-CM | POA: Insufficient documentation

## 2013-04-09 DIAGNOSIS — F0280 Dementia in other diseases classified elsewhere without behavioral disturbance: Secondary | ICD-10-CM | POA: Insufficient documentation

## 2013-04-09 DIAGNOSIS — R42 Dizziness and giddiness: Secondary | ICD-10-CM | POA: Insufficient documentation

## 2013-04-09 DIAGNOSIS — I10 Essential (primary) hypertension: Secondary | ICD-10-CM | POA: Insufficient documentation

## 2013-04-09 NOTE — Assessment & Plan Note (Signed)
Family/ patient did not follow through with consideration of spinal injection. Patient continues to have daily back pain, however she is able to continue independent ADLs as well as social activities. Her preferred exercise is water exercise, was to the pool today, hopefully this can be 01-2 times a week activity.

## 2013-04-09 NOTE — Progress Notes (Signed)
Patient ID: Sydney Arroyo, female   DOB: 1931/05/15, 79 y.o.   MRN: [id number] [facility] [phone])  Chief Complaint  Patient presents with  . Medical Managment of Chronic Issues    blood pressure, Alzhimers, spinal stenosis    HPI: This 79 year old female resident of [facility] section returns to clinic today in followup of Alzheimer's disease, hypertension, spinal stenosis. Patient reports she is doing very well in general, her chronic back pain continues to limit her activity though reports that she was able to get to the pool today for review record shows patient's blood pressure has been very well controlled, she has had no significant change in her cognitive or functional status related to Alzheimer's disease.  Functional status:  Bathing: Independent, Bladder Management: Continent, Bowel Management: Continent, Toileting / Clothing: Independent, Walk: Independent Requires medication management, reminders meals and activity. Requires monitoring for wandering  Allergies  Allergies  Allergen Reactions  . Demerol (Meperidine) Nausea And Vomiting  . Ketorolac Other (See Comments)    unknown   Medications Reviewed  Data Reviewed      Lab: [facility], external 09/16/2012 CBC: Rbc 4.25, Hgb 13.8, Hct 38.8 CMP: Sodium 141, Potassium 3.7, glucose 127, BUN 19, Creatinine 0.65 Lipid: cholesterol 189, triglycerides 46, HDL 46, LDL 113 TSH 1.682 Vitamin J81  555    Review of Systems  DATA OBTAINED: from patient, medical record GENERAL: Feels well No fevers, fatigue, change in appetite or weight SKIN: No itch or open wounds. New rough scaly skin on feet, forearms EYES: No eye pain, dryness or itching  No change in vision EARS: No earache, change in hearing NOSE: No congestion, drainage or bleeding MOUTH/THROAT: No mouth or tooth pain  No difficulty chewing or swallowing RESPIRATORY: No cough, wheezing, SOB CARDIAC: No chest  pain, palpitations  No edema. CHEST/BREASTS: No discomfort, discharge or lumps in breasts GI: No abdominal pain  No N/V/D or constipation  No heartburn or reflux  GU: No dysuria, frequency or urgency  No change in urine volume or character  MUSCULOSKELETAL: Knee pain, No swelling or stiffness  Chronic back pain  No muscle ache, pain, weakness  Gait is steady  No recent falls.  NEUROLOGIC: No dizziness, fainting, headache  No change in mental status (STML).  PSYCHIATRIC: No feelings of anxiety, depression Sleeps well.  No behavior issue.    Physical Exam Filed Vitals:   04/08/13 1606  BP: 128/72  Pulse: 88  Height: 5\' 1"  (1.549 m)  Weight: 189 lb (85.73 kg)   Body mass index is 35.73 kg/(m^2).  GENERAL APPEARANCE: No acute distress, appropriately groomed, Obese body habitus. Alert, pleasant, conversant. HEAD: Normocephalic, atraumatic EYES: Conjunctiva/lids clear. Pupils round, reactive. EOMs intact.  EARS: External exam WNL, canals clear, TM WNL. Hearing decreased  NOSE: No deformity or discharge. MOUTH/THROAT: Lips w/o lesions. Oral mucosa, tongue moist, w/o lesion. Oropharynx w/o redness or lesions.  NECK: Supple, full ROM. No thyroid tenderness, enlargement or nodule LYMPHATICS: No head, neck or supraclavicular adenopathy RESPIRATORY: Breathing is even, unlabored. Lung sounds are clear and full.  BREASTS: RT lateral breast with well healed lumpectomy scar. Left anterior/lateral breast with well healed bx scar. Pendulous breasts, no mass, dimpling, skin changes.  CARDIOVASCULAR: Heart RRR. No murmur or extra heart sounds  ARTERIAL: No carotid bruit.   VENOUS: No varicosities. No venous stasis skin changes  EDEMA: No peripheral or periorbital edema. GASTROINTESTINAL: Abdomen is soft, non-tender, obese w/ normal bowel sounds.  MUSCULOSKELETAL: Moves all  extremities with full ROM, strength and tone. Back is without kyphosis, scoliosis or spinal process tenderness. Gait is  waddling NEUROLOGIC: Oriented to time, place, person. Cranial nerves 2-12 grossly intact, speech clear, no tremor.   PSYCHIATRIC: Mood and affect appropriate to situation  ASSESSMENT/PLAN  Alzheimer's disease Cognitive and functional status appear stable. Patient did have one recent episode of wandering and getting lost. Last nursing staff to repeat Mini-Mental and clock test.  Spinal stenosis of lumbar region Family/ patient did not follow through with consideration of spinal injection. Patient continues to have daily back pain, however she is able to continue independent ADLs as well as social activities. Her preferred exercise is water exercise, was to the pool today, hopefully this can be 01-2 times a week activity.  Essential hypertension Blood pressure readings stable, continue current medications, update lab  Health care maintenance Review of records shows patient has not had a mammogram or bone density scan in greater than 2 years. Patient has history of breast cancer. We'll schedule both exams at [facility] radiology    Follow up: 3 months  Sila Sarsfield T.Myosha Cuadras, NP-C 04/09/2013

## 2013-04-09 NOTE — Assessment & Plan Note (Signed)
Blood pressure readings stable, continue current medications, update lab

## 2013-04-09 NOTE — Assessment & Plan Note (Signed)
Cognitive and functional status appear stable. Patient did have one recent episode of wandering and getting lost. Last nursing staff to repeat Mini-Mental and clock test.

## 2013-04-09 NOTE — Assessment & Plan Note (Signed)
Review of records shows patient has not had a mammogram or bone density scan in greater than 2 years. Patient has history of breast cancer. We'll schedule both exams at [facility] radiology

## 2013-05-01 DIAGNOSIS — H25019 Cortical age-related cataract, unspecified eye: Secondary | ICD-10-CM | POA: Diagnosis not present

## 2013-05-01 DIAGNOSIS — H02839 Dermatochalasis of unspecified eye, unspecified eyelid: Secondary | ICD-10-CM | POA: Diagnosis not present

## 2013-05-01 DIAGNOSIS — H2510 Age-related nuclear cataract, unspecified eye: Secondary | ICD-10-CM | POA: Diagnosis not present

## 2013-05-01 DIAGNOSIS — H18419 Arcus senilis, unspecified eye: Secondary | ICD-10-CM | POA: Diagnosis not present

## 2013-05-28 DIAGNOSIS — M48061 Spinal stenosis, lumbar region without neurogenic claudication: Secondary | ICD-10-CM | POA: Diagnosis not present

## 2013-05-28 DIAGNOSIS — M47817 Spondylosis without myelopathy or radiculopathy, lumbosacral region: Secondary | ICD-10-CM | POA: Diagnosis not present

## 2013-05-28 DIAGNOSIS — IMO0002 Reserved for concepts with insufficient information to code with codable children: Secondary | ICD-10-CM | POA: Diagnosis not present

## 2013-05-28 DIAGNOSIS — M5430 Sciatica, unspecified side: Secondary | ICD-10-CM | POA: Diagnosis not present

## 2013-06-08 ENCOUNTER — Ambulatory Visit
Admission: RE | Admit: 2013-06-08 | Discharge: 2013-06-08 | Disposition: A | Discharge status: Home / Self Care | Payer: Medicare Other | Source: Ambulatory Visit | Attending: Geriatric Medicine | Admitting: Geriatric Medicine

## 2013-06-08 ENCOUNTER — Other Ambulatory Visit: Payer: Self-pay | Admitting: Geriatric Medicine

## 2013-06-08 DIAGNOSIS — M8580 Other specified disorders of bone density and structure, unspecified site: Secondary | ICD-10-CM

## 2013-06-08 DIAGNOSIS — R928 Other abnormal and inconclusive findings on diagnostic imaging of breast: Secondary | ICD-10-CM | POA: Diagnosis not present

## 2013-06-08 DIAGNOSIS — Z0000 Encounter for general adult medical examination without abnormal findings: Secondary | ICD-10-CM

## 2013-06-08 DIAGNOSIS — M949 Disorder of cartilage, unspecified: Secondary | ICD-10-CM | POA: Diagnosis not present

## 2013-06-08 DIAGNOSIS — R921 Mammographic calcification found on diagnostic imaging of breast: Secondary | ICD-10-CM

## 2013-06-18 ENCOUNTER — Encounter: Payer: Self-pay | Admitting: Geriatric Medicine

## 2013-06-18 ENCOUNTER — Non-Acute Institutional Stay: Payer: Medicare Other | Admitting: Geriatric Medicine

## 2013-06-18 DIAGNOSIS — D690 Allergic purpura: Secondary | ICD-10-CM | POA: Insufficient documentation

## 2013-06-18 DIAGNOSIS — L03119 Cellulitis of unspecified part of limb: Secondary | ICD-10-CM | POA: Insufficient documentation

## 2013-06-18 DIAGNOSIS — L02419 Cutaneous abscess of limb, unspecified: Secondary | ICD-10-CM

## 2013-06-18 MED ORDER — AMOXICILLIN-POT CLAVULANATE 875-125 MG PO TABS: 1.0000 | ORAL_TABLET | Freq: Two times a day (BID) | ORAL | Status: AC

## 2013-06-18 NOTE — Progress Notes (Signed)
Patient ID: Sydney Arroyo, female   DOB: Jun 11, 1931, 79 y.o.   MRN: [id number] [facility] [phone])  Chief Complaint  Patient presents with  . Red leg    HPI This is a 79 y.o. female resident of [facility],  Assisted Living section.  Evaluation is requested today due to worsening red rash on left leg.  Patient reports the redness started lower on her leg sometime yesterday, family reported it to nurse last evening. Redness has spread up her leg today. No swelling, is very tender.   Allergies  Allergen Reactions  . Demerol (Meperidine) Nausea And Vomiting  . Ketorolac Other (See Comments)    unknown   Medications Reviewed  Review of Systems  DATA OBTAINED: from patient, nurse, GENERAL: Feels well   No fevers, fatigue, change in appetite or weight SKIN: SEE HPI MOUTH/THROAT: No mouth or tooth pain  No sore throat No difficulty chewing or swallowing RESPIRATORY: No cough, wheezing, SOB CARDIAC: No chest pain, palpitations  No edema. GI: No abdominal pain  No N/V/D or constipation  No heartburn or reflux  GU: No dysuria, frequency or urgency  No change in urine volume or character NEUROLOGIC: No dizziness, fainting, headache,  No change in mental status.  PSYCHIATRIC: No feelings of anxiety, depression Sleeps well.    Physical Exam Filed Vitals:   06/18/13 0931  BP: 137/83  Temp: 97.1 F (36.2 C)  Resp: 20  SpO2: 98%    GENERAL APPEARANCE: No acute distress, appropriately groomed, Overweight body habitus. Alert, pleasant, conversant. SKIN: Large area of petechial rash (purpura) over left lower leg. Extends from ankle past mid shin, nearly circumferential just above ankle.Scabbed linear 1cm wound, lower anterior shin.  HEAD: Normocephalic, atraumatic EYES: Conjunctiva/lids clear.   RESPIRATORY: Breathing is even, unlabored.  MUSCULOSKELETAL: Moves all extremities with full ROM, strength and tone. Marland Kitchen NEUROLOGIC: Oriented to time,  place, person. Speech clear, no tremor.  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 Large area of purpura left lower leg, very tender. Started as smaller area approximately 24hrs ago. Healing skin laceration anterior lower leg. Treat with Augmentin, leg elevation. Monitor for any further skin changes    Follow up: In clinic 06/22/13  Lynton Crescenzo T.Jan Olano, NP-C 06/18/2013

## 2013-06-18 NOTE — Assessment & Plan Note (Addendum)
Possibly due to streptococcal infection. Large area of purpura left lower leg, very tender. Started as smaller area approximately 24hrs ago. Healing skin laceration anterior lower leg. Discussed w/ dr.Green. Treat with Augmentin, leg elevation. Monitor for any further skin changes

## 2013-06-22 ENCOUNTER — Non-Acute Institutional Stay: Payer: Medicare Other | Admitting: Internal Medicine

## 2013-06-22 ENCOUNTER — Ambulatory Visit: Admission: RE | Admit: 2013-06-22 | Payer: Medicare Other | Source: Ambulatory Visit

## 2013-06-22 VITALS — BP 134/84 | HR 79 | Temp 98.1°F | Ht 61.0 in | Wt 188.0 lb

## 2013-06-22 DIAGNOSIS — M19049 Primary osteoarthritis, unspecified hand: Secondary | ICD-10-CM | POA: Insufficient documentation

## 2013-06-22 DIAGNOSIS — G309 Alzheimer's disease, unspecified: Secondary | ICD-10-CM | POA: Diagnosis not present

## 2013-06-22 DIAGNOSIS — L02419 Cutaneous abscess of limb, unspecified: Secondary | ICD-10-CM | POA: Diagnosis not present

## 2013-06-22 DIAGNOSIS — I10 Essential (primary) hypertension: Secondary | ICD-10-CM | POA: Diagnosis not present

## 2013-06-22 DIAGNOSIS — F0280 Dementia in other diseases classified elsewhere without behavioral disturbance: Secondary | ICD-10-CM

## 2013-06-22 DIAGNOSIS — L03119 Cellulitis of unspecified part of limb: Secondary | ICD-10-CM | POA: Diagnosis not present

## 2013-06-22 NOTE — Progress Notes (Signed)
Subjective:    Patient ID: Sydney Arroyo, female    DOB: Aug 03, 1931, 79 y.o.   MRN: [id number]  HPI  Patient is seen at [facility]. She resides in the assisted living area, room 621.  Patient is here mainly to followup Foley infection. He was seen by Maxwell Marion, NP and started on antibiotics last week. At that time the left leg had an area of spreading erythema and tenderness. She has improved steadily since being started on antibiotics. I suspect that she had a strep cellulitis. It is possible that she had a vasculitis.  Patient resides in the assisted living area mainly for her dementia.  She says she is feeling well.  Current Outpatient Prescriptions on File Prior to Visit  Medication Sig Dispense Refill  . amLODipine (NORVASC) 5 MG tablet Take 5 mg by mouth daily.      . benzonatate (TESSALON) 100 MG capsule Take 100 mg by mouth. Take one every 4 hours as needed for cough      . colesevelam (WELCHOL) 625 MG tablet Take by mouth daily. Take one tablet daily for cholesterol      . donepezil (ARICEPT) 10 MG tablet Take 10 mg by mouth at bedtime as needed.      . fexofenadine (ALLEGRA) 180 MG tablet Take 180 mg by mouth daily.      . fluticasone (FLONASE) 50 MCG/ACT nasal spray Place 1-2 sprays into the nose daily as needed. For sinus problems      . HYDROcodone-acetaminophen (NORCO/VICODIN) 5-325 MG per tablet Take 1 tablet by mouth every 6 (six) hours as needed. For pain      . losartan-hydrochlorothiazide (HYZAAR) 100-25 MG per tablet Take 1 tablet by mouth daily.      . meclizine (ANTIVERT) 25 MG tablet Take 25 mg by mouth 3 (three) times daily as needed. For vertigo      . metoprolol succinate (TOPROL-XL) 25 MG 24 hr tablet Take 25 mg by mouth daily.      . montelukast (SINGULAIR) 10 MG tablet Take 10 mg by mouth at bedtime.      . naproxen (NAPROSYN) 500 MG tablet Take 500 mg by mouth 2 (two) times daily with a meal.      . rOPINIRole (REQUIP) 0.5 MG tablet Take 0.5  mg by mouth at bedtime.       No current facility-administered medications on file prior to visit.      Review of Systems  Constitutional: Negative for fever.  HENT:       Recurrent nasal congestion and discharge.  Eyes: Negative for itching.  Respiratory: Negative.   Cardiovascular: Negative for chest pain, palpitations and leg swelling.  Gastrointestinal:       History of intermittent loose stools  Genitourinary:       Urinary incontinence. Wears protective pads.  Musculoskeletal:       Chronic back discomfort. History of spinal stenosis. History of fracture of the femur.  Skin:       Recent cellulitis/vasculitis is resolving. Pain is much improved.  Neurological:       Significant memory loss. No behavioral issues. Remains conversational.  Hematological: Negative.   Psychiatric/Behavioral: Negative.        Objective:BP 134/84  Pulse 79  Temp(Src) 98.1 F (36.7 C) (Oral)  Ht 5\' 1"  (1.549 m)  Wt 188 lb (85.276 kg)  BMI 35.54 kg/m2    Physical Exam  Constitutional: She appears well-developed and well-nourished. No distress.  Eyes:  Corrective lenses.  Neck: No JVD present. No tracheal deviation present. No thyromegaly present.  Cardiovascular: Normal rate, regular rhythm, normal heart sounds and intact distal pulses.  Exam reveals no gallop and no friction rub.   No murmur heard. Pulmonary/Chest: No respiratory distress. She has no wheezes.  Lumpectomy scar left breast. Pacemaker palpable in left chest anteriorly.  Abdominal: She exhibits no distension and no mass. There is no tenderness.  Musculoskeletal: She exhibits no edema and no tenderness.  Lymphadenopathy:    She has no cervical adenopathy.  Neurological: She is alert. No cranial nerve deficit. Coordination normal.  Skin:  Mild erythema of the left leg. There has been clear improvement since last week.  Psychiatric: She has a normal mood and affect. Her behavior is normal. Thought content normal.           Assessment & Plan:  Bacterial skin infection of leg: Improved  Hypertension: Stable  Alzheimer's disease: Unchanged

## 2013-06-24 DIAGNOSIS — M5430 Sciatica, unspecified side: Secondary | ICD-10-CM | POA: Diagnosis not present

## 2013-06-24 DIAGNOSIS — M545 Low back pain, unspecified: Secondary | ICD-10-CM | POA: Diagnosis not present

## 2013-06-28 ENCOUNTER — Encounter: Payer: Self-pay | Admitting: Internal Medicine

## 2013-07-06 ENCOUNTER — Encounter: Payer: Self-pay | Admitting: Internal Medicine

## 2013-08-18 ENCOUNTER — Other Ambulatory Visit: Payer: Self-pay | Admitting: Internal Medicine

## 2013-08-18 DIAGNOSIS — R921 Mammographic calcification found on diagnostic imaging of breast: Secondary | ICD-10-CM

## 2013-09-15 DIAGNOSIS — R488 Other symbolic dysfunctions: Secondary | ICD-10-CM | POA: Diagnosis not present

## 2013-09-15 DIAGNOSIS — F0280 Dementia in other diseases classified elsewhere without behavioral disturbance: Secondary | ICD-10-CM | POA: Diagnosis not present

## 2013-09-15 DIAGNOSIS — M79609 Pain in unspecified limb: Secondary | ICD-10-CM | POA: Diagnosis not present

## 2013-09-15 DIAGNOSIS — B351 Tinea unguium: Secondary | ICD-10-CM | POA: Diagnosis not present

## 2013-09-16 ENCOUNTER — Encounter: Payer: Self-pay | Admitting: Geriatric Medicine

## 2013-09-16 ENCOUNTER — Non-Acute Institutional Stay: Payer: Medicare Other | Admitting: Geriatric Medicine

## 2013-09-16 VITALS — BP 130/82 | HR 88 | Ht 61.0 in | Wt 195.0 lb

## 2013-09-16 DIAGNOSIS — Z853 Personal history of malignant neoplasm of breast: Secondary | ICD-10-CM

## 2013-09-16 DIAGNOSIS — D690 Allergic purpura: Secondary | ICD-10-CM

## 2013-09-16 DIAGNOSIS — B353 Tinea pedis: Secondary | ICD-10-CM

## 2013-09-16 DIAGNOSIS — M48061 Spinal stenosis, lumbar region without neurogenic claudication: Secondary | ICD-10-CM

## 2013-09-16 DIAGNOSIS — E7800 Pure hypercholesterolemia, unspecified: Secondary | ICD-10-CM

## 2013-09-16 DIAGNOSIS — I10 Essential (primary) hypertension: Secondary | ICD-10-CM

## 2013-09-16 DIAGNOSIS — F0280 Dementia in other diseases classified elsewhere without behavioral disturbance: Secondary | ICD-10-CM

## 2013-09-16 NOTE — Assessment & Plan Note (Signed)
Continues medication, update lab
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
## 2013-09-16 NOTE — Assessment & Plan Note (Signed)
Routine mammogram 05/2013 revealed some small calcifications in the left breast. Note to be likely degenerative fibroadenomas. Recommend repeat mammogram in 6 months (11/2013).

## 2013-09-16 NOTE — Assessment & Plan Note (Signed)
08/2013 MMSE 24/30 (0/3 recall, 2/5 orient time, 4/5 orient place), passed clock test. Staff reports increased confusion and disorientation resulting in angry outbursts. Pt.does not have insight re: STML.  Functional status is unchanged; pt remains independent with most ADLs, does require assist with whirlpool and requires medication management. Language skills are intact. Patient continues to go out of facility frequently with friends and family.

## 2013-09-16 NOTE — Assessment & Plan Note (Signed)
BP remains stable, continue current medication, update lab

## 2013-09-16 NOTE — Assessment & Plan Note (Addendum)
Patient has been seen by Dr. Odette [facility]. She had a epidural steroid injection in early July 2014, she returned to Dr. Ollen Bowl July 30. Patient reported less pain with increased ability to walk. Return p.r.n. Patient reports pain remains so it is "not too bad". Continues BID dosing of naproxyn.

## 2013-09-17 DIAGNOSIS — F0280 Dementia in other diseases classified elsewhere without behavioral disturbance: Secondary | ICD-10-CM | POA: Diagnosis not present

## 2013-09-17 DIAGNOSIS — R488 Other symbolic dysfunctions: Secondary | ICD-10-CM | POA: Diagnosis not present

## 2013-09-17 LAB — HEPATIC FUNCTION PANEL
ALK PHOS: 61 U/L (ref 25–125)
ALT: 21 U/L (ref 7–35)
AST: 22 U/L (ref 13–35)

## 2013-09-17 LAB — BASIC METABOLIC PANEL
BUN: 16 mg/dL (ref 4–21)
CREATININE: 0.7 mg/dL (ref 0.5–1.1)
Glucose: 102 mg/dL
POTASSIUM: 3.8 mmol/L (ref 3.4–5.3)

## 2013-09-17 LAB — LIPID PANEL
CHOLESTEROL: 175 mg/dL (ref 0–200)
LDL CALC: 87 mg/dL

## 2013-09-17 LAB — CBC AND DIFFERENTIAL
HCT: 41 % (ref 36–46)
PLATELETS: 194 10*3/uL (ref 150–399)
WBC: 7.2 10^3/mL

## 2013-09-17 LAB — TSH: TSH: 2.24 u[IU]/mL (ref 0.41–5.90)

## 2013-09-18 DIAGNOSIS — F0280 Dementia in other diseases classified elsewhere without behavioral disturbance: Secondary | ICD-10-CM | POA: Diagnosis not present

## 2013-09-18 DIAGNOSIS — R488 Other symbolic dysfunctions: Secondary | ICD-10-CM | POA: Diagnosis not present

## 2013-09-20 ENCOUNTER — Encounter: Payer: Self-pay | Admitting: Geriatric Medicine

## 2013-09-20 NOTE — Assessment & Plan Note (Signed)
Skin condition of both feet is consistent with tinea pedis. Treat with topical antifungal twice a day for 4 weeks.

## 2013-09-20 NOTE — Assessment & Plan Note (Signed)
Infection resolved without complication

## 2013-09-21 DIAGNOSIS — R488 Other symbolic dysfunctions: Secondary | ICD-10-CM | POA: Diagnosis not present

## 2013-09-21 DIAGNOSIS — F0280 Dementia in other diseases classified elsewhere without behavioral disturbance: Secondary | ICD-10-CM | POA: Diagnosis not present

## 2013-09-23 DIAGNOSIS — R488 Other symbolic dysfunctions: Secondary | ICD-10-CM | POA: Diagnosis not present

## 2013-09-23 DIAGNOSIS — F0280 Dementia in other diseases classified elsewhere without behavioral disturbance: Secondary | ICD-10-CM | POA: Diagnosis not present

## 2013-09-24 DIAGNOSIS — F0390 Unspecified dementia without behavioral disturbance: Secondary | ICD-10-CM | POA: Diagnosis not present

## 2013-09-25 DIAGNOSIS — R488 Other symbolic dysfunctions: Secondary | ICD-10-CM | POA: Diagnosis not present

## 2013-09-25 DIAGNOSIS — F0280 Dementia in other diseases classified elsewhere without behavioral disturbance: Secondary | ICD-10-CM | POA: Diagnosis not present

## 2013-09-28 DIAGNOSIS — F0280 Dementia in other diseases classified elsewhere without behavioral disturbance: Secondary | ICD-10-CM | POA: Diagnosis not present

## 2013-09-28 DIAGNOSIS — M6281 Muscle weakness (generalized): Secondary | ICD-10-CM | POA: Diagnosis not present

## 2013-09-28 DIAGNOSIS — R488 Other symbolic dysfunctions: Secondary | ICD-10-CM | POA: Diagnosis not present

## 2013-10-26 DIAGNOSIS — M6281 Muscle weakness (generalized): Secondary | ICD-10-CM | POA: Diagnosis not present

## 2013-10-26 DIAGNOSIS — F0280 Dementia in other diseases classified elsewhere without behavioral disturbance: Secondary | ICD-10-CM | POA: Diagnosis not present

## 2013-10-26 DIAGNOSIS — R488 Other symbolic dysfunctions: Secondary | ICD-10-CM | POA: Diagnosis not present

## 2013-10-27 DIAGNOSIS — M6281 Muscle weakness (generalized): Secondary | ICD-10-CM | POA: Diagnosis not present

## 2013-10-27 DIAGNOSIS — R488 Other symbolic dysfunctions: Secondary | ICD-10-CM | POA: Diagnosis not present

## 2013-10-27 DIAGNOSIS — F0280 Dementia in other diseases classified elsewhere without behavioral disturbance: Secondary | ICD-10-CM | POA: Diagnosis not present

## 2013-10-28 DIAGNOSIS — F0280 Dementia in other diseases classified elsewhere without behavioral disturbance: Secondary | ICD-10-CM | POA: Diagnosis not present

## 2013-10-28 DIAGNOSIS — R488 Other symbolic dysfunctions: Secondary | ICD-10-CM | POA: Diagnosis not present

## 2013-10-28 DIAGNOSIS — M6281 Muscle weakness (generalized): Secondary | ICD-10-CM | POA: Diagnosis not present

## 2013-11-03 DIAGNOSIS — M6281 Muscle weakness (generalized): Secondary | ICD-10-CM | POA: Diagnosis not present

## 2013-11-03 DIAGNOSIS — R488 Other symbolic dysfunctions: Secondary | ICD-10-CM | POA: Diagnosis not present

## 2013-11-03 DIAGNOSIS — F0280 Dementia in other diseases classified elsewhere without behavioral disturbance: Secondary | ICD-10-CM | POA: Diagnosis not present

## 2013-11-04 ENCOUNTER — Encounter: Payer: Self-pay | Admitting: Geriatric Medicine

## 2013-11-04 ENCOUNTER — Non-Acute Institutional Stay: Payer: Medicare Other | Admitting: Geriatric Medicine

## 2013-11-04 VITALS — BP 124/82 | HR 88 | Temp 98.3°F | Ht 61.0 in | Wt 191.0 lb

## 2013-11-04 DIAGNOSIS — J019 Acute sinusitis, unspecified: Secondary | ICD-10-CM | POA: Diagnosis not present

## 2013-11-04 MED ORDER — AMOXICILLIN 500 MG PO CAPS: 500.0000 mg | ORAL_CAPSULE | Freq: Two times a day (BID) | ORAL | Status: AC

## 2013-11-04 MED ORDER — LORATADINE 10 MG PO TABS: 10.0000 mg | ORAL_TABLET | Freq: Two times a day (BID) | ORAL | Status: DC

## 2013-11-04 NOTE — Assessment & Plan Note (Signed)
Symptoms of head congestion, cough that is now productive as well as hoarse voice. Symptoms are getting worse over the last few days. Will treat with antibiotic and antihistamine

## 2013-11-04 NOTE — Progress Notes (Signed)
Patient ID: Sydney Arroyo, female   DOB: 05/01/1931, 80 y.o.   MRN: [id number]  [facility] [phone])  Code Status: Living Will. Full Code     Contact Information   Name Relation Home Work [place] Other [phone]     Lenor Coffin Daughter [phone]        Chief Complaint  Patient presents with  . Cough    with runny nose for 2-3 weeks    HPI: This is a 79 y.o. female resident of [facility], Assisted Living  section.  Evaluation is requested today due to cough. Patient reports she's had cough and runny nose the last 2-3 weeks. Facility records shows patient with head congestion, cough, nonproductive and hoarse voice since 10/30/2013. She's been treated supportively with extra fluids including hot tea with honey, p.r.n. Robitussin. She has been afebrile. Patient tells me today that the cough is productive of gray green sputum. She is feeling worse over the last few days.    Allergies  Allergen Reactions  . Demerol [Meperidine] Nausea And Vomiting  . Ketorolac Other (See Comments)    unknown  . Percocet [Oxycodone-Acetaminophen] Other (See Comments)    unknown  . Toradol [Ketorolac Tromethamine]       Medication List       This list is accurate as of: 11/04/13 11:21 AM.  Always use your most recent med list.               amLODipine 5 MG tablet  Commonly known as:  NORVASC  Take 5 mg by mouth daily.     amoxicillin 500 MG tablet  Commonly known as:  AMOXIL  Take 500 mg by mouth. Take 4 tablets one hour prior to dental     colesevelam 625 MG tablet  Commonly known as:  WELCHOL  Take by mouth daily. Take one tablet daily for cholesterol     donepezil 10 MG tablet  Commonly known as:  ARICEPT  Take 10 mg by mouth at bedtime as needed.     fexofenadine 180 MG tablet  Commonly known as:  ALLEGRA  Take 180 mg by mouth daily.     fluticasone 50 MCG/ACT nasal spray  Commonly known as:  FLONASE    Place 1-2 sprays into the nose daily as needed. For sinus problems     losartan-hydrochlorothiazide 100-25 MG per tablet  Commonly known as:  HYZAAR  Take 1 tablet by mouth daily.     meclizine 25 MG tablet  Commonly known as:  ANTIVERT  Take 25 mg by mouth 3 (three) times daily as needed. For vertigo     metoprolol succinate 25 MG 24 hr tablet  Commonly known as:  TOPROL-XL  Take 25 mg by mouth daily.     montelukast 10 MG tablet  Commonly known as:  SINGULAIR  Take 10 mg by mouth at bedtime.     naproxen 500 MG tablet  Commonly known as:  NAPROSYN  Take 500 mg by mouth 2 (two) times daily with a meal.     ROBITUSSIN DM 10-100 MG/5ML liquid  Generic drug:  Dextromethorphan-Guaifenesin  Take 5 mLs by mouth. 10cc every 4 hours as needed for cough up to 2 days     rOPINIRole 0.5 MG tablet  Commonly known as:  REQUIP  Take 0.5 mg by mouth at bedtime.     sertraline 100 MG tablet  Commonly known as:  ZOLOFT  Take 100 mg by mouth  daily.         Review of Systems  DATA OBTAINED: from patient, medical record GENERAL: does not feel well, is tired, mildly decreased appetite     EYES: No eye pain, dryness or itching  No change in vision EARS: No earache, tinnitus, change in hearing NOSE: Congestion and drainage MOUTH/THROAT: No mouth or tooth pain  No sore throat  No difficulty chewing or swallowing Hoarse voice RESPIRATORY:  Productive cough No wheezing, SOB CARDIAC: No chest pain, palpitations   GI: No abdominal pain  No N/V/D or constipation  No heartburn or reflux  GU: No dysuria, frequency or urgency  No change in urine volume or character     Physical Exam Filed Vitals:   11/04/13 1059  BP: 124/82  Pulse: 88  Temp: 98.3 F (36.8 C)  TempSrc: Oral  Height: 5\' 1"  (1.549 m)  Weight: 191 lb (86.637 kg)   Body mass index is 36.11 kg/(m^2).  GENERAL APPEARANCE: No acute distress, appropriately groomed, Obese body habitus. Alert, pleasant, conversant. SKIN: No  diaphoresis.    HEAD: Normocephalic, atraumatic EYES: Conjunctiva/lids clear. Pupils round, reactive. periorbital puffiness present EARS: External exam WNL, canals clear, TM WNL. Diminished Hearing  , not new NOSE: No deformity or discharge. MOUTH/THROAT: Lips w/o lesions. Oral mucosa, tongue moist, w/o lesion. Oropharynx w/o redness or lesions.  voice is hoarse, with nasal quality  NECK: Supple, full ROM. No thyroid tenderness, enlargement or nodule LYMPHATICS: No head, neck or supraclavicular adenopathy RESPIRATORY: Breathing is even, unlabored. Lung sounds are clear and full.  harsh cough present  CARDIOVASCULAR: Heart RRR. No murmur or extra heart sounds MUSCULOSKELETAL:  Gait is steady w/ walker NEUROLOGIC: Oriented to time, place, person.  PSYCHIATRIC: Mood and affect appropriate to situation  ASSESSMENT/PLAN  Sinusitis, acute Symptoms of head congestion, cough that is now productive as well as hoarse voice. Symptoms are getting worse over the last few days. Will treat with antibiotic and antihistamine   Follow up: AS scheduled in [facility]  Hallee Mckenny T.Deaisha Welborn, NP-C 11/04/2013

## 2013-11-05 DIAGNOSIS — R488 Other symbolic dysfunctions: Secondary | ICD-10-CM | POA: Diagnosis not present

## 2013-11-05 DIAGNOSIS — F0280 Dementia in other diseases classified elsewhere without behavioral disturbance: Secondary | ICD-10-CM | POA: Diagnosis not present

## 2013-11-05 DIAGNOSIS — M6281 Muscle weakness (generalized): Secondary | ICD-10-CM | POA: Diagnosis not present

## 2013-11-06 DIAGNOSIS — M6281 Muscle weakness (generalized): Secondary | ICD-10-CM | POA: Diagnosis not present

## 2013-11-06 DIAGNOSIS — F0280 Dementia in other diseases classified elsewhere without behavioral disturbance: Secondary | ICD-10-CM | POA: Diagnosis not present

## 2013-11-06 DIAGNOSIS — R488 Other symbolic dysfunctions: Secondary | ICD-10-CM | POA: Diagnosis not present

## 2013-11-10 DIAGNOSIS — R488 Other symbolic dysfunctions: Secondary | ICD-10-CM | POA: Diagnosis not present

## 2013-11-10 DIAGNOSIS — M6281 Muscle weakness (generalized): Secondary | ICD-10-CM | POA: Diagnosis not present

## 2013-11-10 DIAGNOSIS — F0280 Dementia in other diseases classified elsewhere without behavioral disturbance: Secondary | ICD-10-CM | POA: Diagnosis not present

## 2013-11-11 DIAGNOSIS — F0390 Unspecified dementia without behavioral disturbance: Secondary | ICD-10-CM | POA: Diagnosis not present

## 2013-11-11 DIAGNOSIS — M6281 Muscle weakness (generalized): Secondary | ICD-10-CM | POA: Diagnosis not present

## 2013-11-11 DIAGNOSIS — R488 Other symbolic dysfunctions: Secondary | ICD-10-CM | POA: Diagnosis not present

## 2013-11-11 DIAGNOSIS — F0280 Dementia in other diseases classified elsewhere without behavioral disturbance: Secondary | ICD-10-CM | POA: Diagnosis not present

## 2013-11-12 DIAGNOSIS — R488 Other symbolic dysfunctions: Secondary | ICD-10-CM | POA: Diagnosis not present

## 2013-11-12 DIAGNOSIS — M6281 Muscle weakness (generalized): Secondary | ICD-10-CM | POA: Diagnosis not present

## 2013-11-12 DIAGNOSIS — F0280 Dementia in other diseases classified elsewhere without behavioral disturbance: Secondary | ICD-10-CM | POA: Diagnosis not present

## 2013-11-23 DIAGNOSIS — R488 Other symbolic dysfunctions: Secondary | ICD-10-CM | POA: Diagnosis not present

## 2013-11-23 DIAGNOSIS — F0280 Dementia in other diseases classified elsewhere without behavioral disturbance: Secondary | ICD-10-CM | POA: Diagnosis not present

## 2013-11-23 DIAGNOSIS — M6281 Muscle weakness (generalized): Secondary | ICD-10-CM | POA: Diagnosis not present

## 2013-11-26 DIAGNOSIS — F0280 Dementia in other diseases classified elsewhere without behavioral disturbance: Secondary | ICD-10-CM | POA: Diagnosis not present

## 2013-11-26 DIAGNOSIS — M6281 Muscle weakness (generalized): Secondary | ICD-10-CM | POA: Diagnosis not present

## 2013-11-26 DIAGNOSIS — G309 Alzheimer's disease, unspecified: Secondary | ICD-10-CM | POA: Diagnosis not present

## 2013-12-01 DIAGNOSIS — F0280 Dementia in other diseases classified elsewhere without behavioral disturbance: Secondary | ICD-10-CM | POA: Diagnosis not present

## 2013-12-01 DIAGNOSIS — M6281 Muscle weakness (generalized): Secondary | ICD-10-CM | POA: Diagnosis not present

## 2013-12-02 DIAGNOSIS — M6281 Muscle weakness (generalized): Secondary | ICD-10-CM | POA: Diagnosis not present

## 2013-12-02 DIAGNOSIS — G309 Alzheimer's disease, unspecified: Secondary | ICD-10-CM | POA: Diagnosis not present

## 2013-12-02 DIAGNOSIS — F0280 Dementia in other diseases classified elsewhere without behavioral disturbance: Secondary | ICD-10-CM | POA: Diagnosis not present

## 2013-12-03 DIAGNOSIS — M6281 Muscle weakness (generalized): Secondary | ICD-10-CM | POA: Diagnosis not present

## 2013-12-03 DIAGNOSIS — F0280 Dementia in other diseases classified elsewhere without behavioral disturbance: Secondary | ICD-10-CM | POA: Diagnosis not present

## 2013-12-07 ENCOUNTER — Non-Acute Institutional Stay: Payer: Medicare Other | Admitting: Internal Medicine

## 2013-12-07 ENCOUNTER — Encounter: Payer: Self-pay | Admitting: Internal Medicine

## 2013-12-07 VITALS — BP 132/80 | HR 88 | Wt 191.0 lb

## 2013-12-07 DIAGNOSIS — I10 Essential (primary) hypertension: Secondary | ICD-10-CM

## 2013-12-07 DIAGNOSIS — M19049 Primary osteoarthritis, unspecified hand: Secondary | ICD-10-CM

## 2013-12-07 DIAGNOSIS — M48061 Spinal stenosis, lumbar region without neurogenic claudication: Secondary | ICD-10-CM

## 2013-12-07 DIAGNOSIS — M545 Low back pain, unspecified: Secondary | ICD-10-CM | POA: Diagnosis not present

## 2013-12-07 DIAGNOSIS — G309 Alzheimer's disease, unspecified: Secondary | ICD-10-CM

## 2013-12-07 DIAGNOSIS — F0280 Dementia in other diseases classified elsewhere without behavioral disturbance: Secondary | ICD-10-CM

## 2013-12-07 DIAGNOSIS — M5430 Sciatica, unspecified side: Secondary | ICD-10-CM | POA: Diagnosis not present

## 2013-12-07 NOTE — Patient Instructions (Signed)
Continue current medications. 

## 2013-12-08 DIAGNOSIS — M6281 Muscle weakness (generalized): Secondary | ICD-10-CM | POA: Diagnosis not present

## 2013-12-08 DIAGNOSIS — F0280 Dementia in other diseases classified elsewhere without behavioral disturbance: Secondary | ICD-10-CM | POA: Diagnosis not present

## 2013-12-08 DIAGNOSIS — G309 Alzheimer's disease, unspecified: Secondary | ICD-10-CM | POA: Diagnosis not present

## 2013-12-09 DIAGNOSIS — G309 Alzheimer's disease, unspecified: Secondary | ICD-10-CM | POA: Diagnosis not present

## 2013-12-09 DIAGNOSIS — F0280 Dementia in other diseases classified elsewhere without behavioral disturbance: Secondary | ICD-10-CM | POA: Diagnosis not present

## 2013-12-09 DIAGNOSIS — M6281 Muscle weakness (generalized): Secondary | ICD-10-CM | POA: Diagnosis not present

## 2013-12-15 DIAGNOSIS — F0280 Dementia in other diseases classified elsewhere without behavioral disturbance: Secondary | ICD-10-CM | POA: Diagnosis not present

## 2013-12-15 DIAGNOSIS — M6281 Muscle weakness (generalized): Secondary | ICD-10-CM | POA: Diagnosis not present

## 2013-12-16 DIAGNOSIS — F0280 Dementia in other diseases classified elsewhere without behavioral disturbance: Secondary | ICD-10-CM | POA: Diagnosis not present

## 2013-12-16 DIAGNOSIS — M6281 Muscle weakness (generalized): Secondary | ICD-10-CM | POA: Diagnosis not present

## 2013-12-17 DIAGNOSIS — M6281 Muscle weakness (generalized): Secondary | ICD-10-CM | POA: Diagnosis not present

## 2013-12-17 DIAGNOSIS — F0280 Dementia in other diseases classified elsewhere without behavioral disturbance: Secondary | ICD-10-CM | POA: Diagnosis not present

## 2013-12-17 DIAGNOSIS — G309 Alzheimer's disease, unspecified: Secondary | ICD-10-CM | POA: Diagnosis not present

## 2013-12-22 DIAGNOSIS — F0280 Dementia in other diseases classified elsewhere without behavioral disturbance: Secondary | ICD-10-CM | POA: Diagnosis not present

## 2013-12-22 DIAGNOSIS — M6281 Muscle weakness (generalized): Secondary | ICD-10-CM | POA: Diagnosis not present

## 2013-12-22 DIAGNOSIS — G309 Alzheimer's disease, unspecified: Secondary | ICD-10-CM | POA: Diagnosis not present

## 2013-12-23 DIAGNOSIS — F0280 Dementia in other diseases classified elsewhere without behavioral disturbance: Secondary | ICD-10-CM | POA: Diagnosis not present

## 2013-12-23 DIAGNOSIS — M6281 Muscle weakness (generalized): Secondary | ICD-10-CM | POA: Diagnosis not present

## 2013-12-23 DIAGNOSIS — G309 Alzheimer's disease, unspecified: Secondary | ICD-10-CM | POA: Diagnosis not present

## 2013-12-24 ENCOUNTER — Ambulatory Visit
Admission: RE | Admit: 2013-12-24 | Discharge: 2013-12-24 | Disposition: A | Discharge status: Home / Self Care | Payer: Medicare Other | Source: Ambulatory Visit | Attending: Internal Medicine | Admitting: Internal Medicine

## 2013-12-24 DIAGNOSIS — R062 Wheezing: Secondary | ICD-10-CM | POA: Diagnosis not present

## 2013-12-24 DIAGNOSIS — R921 Mammographic calcification found on diagnostic imaging of breast: Secondary | ICD-10-CM

## 2013-12-24 DIAGNOSIS — R928 Other abnormal and inconclusive findings on diagnostic imaging of breast: Secondary | ICD-10-CM | POA: Diagnosis not present

## 2013-12-24 LAB — HM MAMMOGRAPHY: HM Mammogram: NEGATIVE

## 2013-12-28 ENCOUNTER — Non-Acute Institutional Stay: Payer: Medicare Other | Admitting: Geriatric Medicine

## 2013-12-28 ENCOUNTER — Encounter: Payer: Self-pay | Admitting: Geriatric Medicine

## 2013-12-28 DIAGNOSIS — J189 Pneumonia, unspecified organism: Secondary | ICD-10-CM

## 2013-12-28 DIAGNOSIS — H6690 Otitis media, unspecified, unspecified ear: Secondary | ICD-10-CM

## 2013-12-28 DIAGNOSIS — R059 Cough, unspecified: Secondary | ICD-10-CM | POA: Diagnosis not present

## 2013-12-28 DIAGNOSIS — R05 Cough: Secondary | ICD-10-CM | POA: Diagnosis not present

## 2013-12-28 NOTE — Assessment & Plan Note (Addendum)
Cough/ wheeze not improved after course of Avelox. Rt ear appears to have infection as well. Change to Augmentin, add diuretic for next 3 days to reduce mild pulmonary edema. Obtain lab in AM

## 2013-12-28 NOTE — Assessment & Plan Note (Signed)
Rt ear pain, TM cloudy. Pt with chronic rhinitis, s/p sinusitis 1 month ago. Now also with PNA. Change antibiotic to Augmentin.

## 2013-12-28 NOTE — Progress Notes (Signed)
Patient ID: Sydney Arroyo, female   DOB: 1931-02-20, 80 y.o.   MRN: [id number]  [place] (13)  Code Status: Living Will. Full Code     Contact Information   Name Relation Home Work [place] Other [place] Daughter [phone]        Chief Complaint  Patient presents with  . Pneumonia    HPI: This is a 80 y.o. female resident of [place], Assisted Living  section.  Evaluation is requested today due to persistent cough.   Last visit: Sinusitis, acute Symptoms of head congestion, cough that is now productive as well as hoarse voice. Symptoms are getting worse over the last few days. Will treat with antibiotic and antihistamine  Since last visit, patient completed course of amoxicillin for sinusitis. Last week patient complained of not feeling well wanted to rest in the afternoon,(unusual for her). By the next day wheezing and coughing were noted. Chest x-ray was obtained and showed interstitial pneumonitis. P.o. antibiotic was started.  This morning nursing supervisor reported to me the patient "feels horrible", continues to have nonproductive cough, remains afebrile. Chest x-ray was repeated, results below.   Allergies  Allergen Reactions  . Demerol [Meperidine] Nausea And Vomiting  . Ketorolac Other (See Comments)    unknown  . Percocet [Oxycodone-Acetaminophen] Other (See Comments)    unknown  . Toradol [Ketorolac Tromethamine]    MEDICATION - Reviewed  DATA REVIEWED  Radiologic Studies  [facility] mobile x-ray 12/24/2013 CXR: No cardiomegaly, pulmonary vascular congestion or effusion. Small area of atelectasis or pneumonitis right lung base. No consolidation, no lung nodule  12/28/2013 Chest x-ray: Limited inspiration secondary patient's condition body habitus. Minimal cardiomegaly appears new with minimal pulmonary vascular congestion. A small left pleural effusion, new patchy  atelectasis or pneumonitis at the lung bases worse on the right normal left.  Laboratory studies Lab Results- [facility] 09/17/2014  Component Value   WBC 7.2   HGB 13.9   HCT 41   PLT 194       GLUCOSE 102   CHOL 175   LDLCALC 87   ALT 21   AST 22   NA 140   K 3.8   CREATININE 0.7   BUN 16       TSH 2.24     REVIEW OF SYSTEMS DATA OBTAINED: from patient,nurse  medical record GENERAL: does not feel well, is tired, mildly decreased appetite nurses report patient felt "terrible this morning". This afternoon patient is ambulating around the building.     EYES: No eye pain, dryness or itching  No change in vision EARS: Complains of right earache, No tinnitus, change in hearing NOSE: No nasal congestion drainage or bleeding MOUTH/THROAT: No mouth or tooth pain  No sore throat  No difficulty chewing or swallowing RESPIRATORY:  Non productive cough, wheezing, denies SOB CARDIAC: No chest pain, palpitations , edema GI: No abdominal pain  No N/V/D or constipation  No heartburn or reflux  GU: No dysuria, frequency or urgency  No change in urine volume or character       PHYSICAL EXAM  Filed Vitals:   12/28/13 1530  BP: 135/94  Pulse: 80  Temp: 98.5 F (36.9 C)  Resp: 20  Weight: 182 lb 6.4 oz (82.736 kg)  SpO2: 94%   Body mass index is 34.48 kg/(m^2).  GENERAL APPEARANCE: No acute distress, appropriately groomed, Obese body habitus. Alert, pleasant, conversant. SKIN: No diaphoresis.  HEAD: Normocephalic, atraumatic EYES: Conjunctiva/lids clear. Pupils round, reactive.   EARS: External exam WNL, right TM cloudy, debris in the ear canal left ear canal with significant cerumen TM not visible. Diminished Hearing  , not new NOSE: No deformity or discharge. MOUTH/THROAT: Lips w/o lesions. Oral mucosa, tongue moist, w/o lesion. Oropharynx w/o redness or lesions.    NECK: Supple, full ROM. No thyroid tenderness, enlargement or nodule LYMPHATICS: No head, neck or supraclavicular  adenopathy RESPIRATORY: Breathing is even, mildly labored. Lung sounds are clear posteriorly, wheezing present anterior upper lung fields. Dry cough present CARDIOVASCULAR: Heart RRR. No murmur or extra heart sounds MUSCULOSKELETAL:  Gait is steady w/ walker NEUROLOGIC: Not oriented to time,oriented to  place, person.  PSYCHIATRIC: Mood and affect appropriate to situation  ASSESSMENT/PLAN  CAP (community acquired pneumonia) Cough/ wheeze not improved after course of Avelox. Rt ear appears to have infection as well. Change to Augmentin, add diuretic for next 3 days to reduce mild pulmonary edema. Obtain lab in AM  Otitis media Rt ear pain, TM cloudy. Pt with chronic rhinitis, s/p sinusitis 1 month ago. Now also with PNA. Change antibiotic to Augmentin.   Follow up: As needed or as scheduled in [place] [facility]  Pete Schnitzer T.Tristin Vandeusen, NP-C 12/28/2013

## 2014-01-26 LAB — BASIC METABOLIC PANEL
BUN: 19 mg/dL (ref 4–21)
Creatinine: 0.9 mg/dL (ref 0.5–1.1)
GLUCOSE: 101 mg/dL
Potassium: 3.7 mmol/L (ref 3.4–5.3)
Sodium: 139 mmol/L (ref 137–147)

## 2014-01-26 LAB — CBC AND DIFFERENTIAL
HCT: 41 % (ref 36–46)
Hemoglobin: 14 g/dL (ref 12.0–16.0)
PLATELETS: 213 10*3/uL (ref 150–399)
WBC: 7.5 10*3/mL

## 2014-01-28 NOTE — Progress Notes (Signed)
Patient ID: Sydney Arroyo, female   DOB: 01/16/1931, 80 y.o.   MRN: [id number]    Location:  [facility] (12)    Allergies  Allergen Reactions  . Demerol [Meperidine] Nausea And Vomiting  . Ketorolac Other (See Comments)    unknown  . Percocet [Oxycodone-Acetaminophen] Other (See Comments)    unknown  . Toradol [Ketorolac Tromethamine]     Chief Complaint  Patient presents with  . Medical Managment of Chronic Issues    blood pressure, cholesterol, Alzheimers    HPI:  Hypertension: controlled  Alzheimer's disease: unchanged  Osteoarthritis of hand: painful at times  Spinal stenosis of lumbar region: to get injection in the lower lumbar area today    Medications: Patient's Medications  New Prescriptions   No medications on file  Previous Medications   AMLODIPINE (NORVASC) 5 MG TABLET    Take 5 mg by mouth daily.   AMOXICILLIN (AMOXIL) 500 MG TABLET    Take 500 mg by mouth. Take 4 tablets one hour prior to dental   COLESEVELAM (WELCHOL) 625 MG TABLET    Take by mouth daily. Take one tablet daily for cholesterol   DONEPEZIL (ARICEPT) 10 MG TABLET    Take 10 mg by mouth at bedtime as needed.   FEXOFENADINE (ALLEGRA) 180 MG TABLET    Take 180 mg by mouth daily.   FLUTICASONE (FLONASE) 50 MCG/ACT NASAL SPRAY    Place 1-2 sprays into the nose daily as needed. For sinus problems   LOSARTAN-HYDROCHLOROTHIAZIDE (HYZAAR) 100-25 MG PER TABLET    Take 1 tablet by mouth daily.   MECLIZINE (ANTIVERT) 25 MG TABLET    Take 25 mg by mouth 3 (three) times daily as needed. For vertigo   METOPROLOL SUCCINATE (TOPROL-XL) 25 MG 24 HR TABLET    Take 25 mg by mouth daily.   MONTELUKAST (SINGULAIR) 10 MG TABLET    Take 10 mg by mouth at bedtime.   MOXIFLOXACIN (AVELOX) 400 MG TABLET    Take 400 mg by mouth daily at 8 pm.   NAPROXEN (NAPROSYN) 500 MG TABLET    Take 500 mg by mouth 2 (two) times daily with a meal.   ROPINIROLE (REQUIP) 0.5 MG  TABLET    Take 0.5 mg by mouth at bedtime.   SERTRALINE (ZOLOFT) 100 MG TABLET    Take 100 mg by mouth daily.  Modified Medications   No medications on file  Discontinued Medications   DEXTROMETHORPHAN-GUAIFENESIN (ROBITUSSIN DM) 10-100 MG/5ML LIQUID    Take 5 mLs by mouth. 10cc every 4 hours as needed for cough up to 2 days   LORATADINE (CLARITIN) 10 MG TABLET    Take 1 tablet (10 mg total) by mouth 2 (two) times daily.     Review of Systems  Constitutional: Negative for fever.  HENT:       Recurrent nasal congestion and discharge.  Eyes: Negative for itching.  Respiratory: Negative.   Cardiovascular: Negative for chest pain, palpitations and leg swelling.  Gastrointestinal:       History of intermittent loose stools  Genitourinary:       Urinary incontinence. Wears protective pads.  Musculoskeletal:       Chronic back discomfort. History of spinal stenosis. History of fracture of the femur.  Skin:       Recent cellulitis/vasculitis is resolving. Pain is much improved.  Neurological:       Significant memory loss. No behavioral issues. Remains conversational.  Hematological: Negative.   Psychiatric/Behavioral: Negative.     Filed Vitals:   12/07/13 1611  BP: 132/80  Pulse: 88  Weight: 191 lb (86.637 kg)   Physical Exam  Constitutional: She appears well-developed and well-nourished. No distress.  Eyes:  Corrective lenses.  Neck: No JVD present. No tracheal deviation present. No thyromegaly present.  Cardiovascular: Normal rate, regular rhythm, normal heart sounds and intact distal pulses.  Exam reveals no gallop and no friction rub.   No murmur heard. Pulmonary/Chest: No respiratory distress. She has no wheezes.  Lumpectomy scar left breast. Pacemaker palpable in left chest anteriorly.  Abdominal: She exhibits no distension and no mass. There is no tenderness.  Musculoskeletal: She exhibits no edema and no tenderness.  Lymphadenopathy:    She has no cervical  adenopathy.  Neurological: She is alert. No cranial nerve deficit. Coordination normal.  Skin:  Mild erythema of the left leg. There has been clear improvement since last week.  Psychiatric: She has a normal mood and affect. Her behavior is normal. Thought content normal.     Labs reviewed: No visits with results within 3 Month(s) from this visit. Latest known visit with results is:  Office Visit on 01/12/2013  Component Date Value Ref Range Status  . DEVICE MODEL PM 01/12/2013 109323   Final-Edited  . [id number] 01/12/2013 IMPLANTED HERE, NOT      Final-Edited  . [id number] 01/12/2013 IMPLANTED HERE, NOT      Final-Edited  . PACEART TECH NOTES PM 01/12/2013    Final-Edited                   Value:Pacemaker check in clinic. Normal device function. Thresholds, sensing, impedances consistent with previous measurements. Device programmed to maximize longevity. Mode switch and VHR episodes are short runs of atach with 1:1 conduction. Device programmed                          at appropriate safety margins. Histogram distribution appropriate for patient activity level. Device programmed to optimize intrinsic conduction. Estimated longevity >5 years. Patient to be seen in 6 months with device clinic and 12 months with Dr                          Rayann Heman. Patient education completed.  . ATRIAL PACING PM 01/12/2013 3   Final-Edited  . VENTRICULAR PACING PM 01/12/2013 0   Final-Edited  . AL IMPEDENCE PM 01/12/2013 590   Final-Edited  . RV LEAD IMPEDENCE PM 01/12/2013 790   Final-Edited  . AL AMPLITUDE 01/12/2013 2.3   Final-Edited  . RV LEAD AMPLITUDE 01/12/2013 12   Final-Edited  . AL THRESHOLD 01/12/2013 0.7   Final-Edited  . RV LEAD THRESHOLD 01/12/2013 0.6   Final-Edited      Assessment/Plan  1. Hypertension controlled  2. Alzheimer's disease unchanged  3. Osteoarthritis of hand Try Aspeercream  4. Spinal stenosis of lumbar region Has epidural injection scheduled

## 2014-02-02 ENCOUNTER — Encounter: Payer: Self-pay | Admitting: *Deleted

## 2014-02-10 ENCOUNTER — Encounter: Payer: Self-pay | Admitting: Cardiology

## 2014-02-24 ENCOUNTER — Ambulatory Visit (INDEPENDENT_AMBULATORY_CARE_PROVIDER_SITE_OTHER): Payer: Medicare Other | Admitting: Cardiology

## 2014-02-24 ENCOUNTER — Encounter: Payer: Self-pay | Admitting: Cardiology

## 2014-02-24 ENCOUNTER — Encounter: Payer: Self-pay | Admitting: Geriatric Medicine

## 2014-02-24 ENCOUNTER — Non-Acute Institutional Stay: Payer: Medicare Other | Admitting: Geriatric Medicine

## 2014-02-24 ENCOUNTER — Encounter: Payer: Self-pay | Admitting: Internal Medicine

## 2014-02-24 VITALS — BP 128/68 | HR 83 | Wt 181.0 lb

## 2014-02-24 VITALS — BP 132/74 | HR 74 | Wt 186.0 lb

## 2014-02-24 DIAGNOSIS — F0280 Dementia in other diseases classified elsewhere without behavioral disturbance: Secondary | ICD-10-CM

## 2014-02-24 DIAGNOSIS — Z950 Presence of cardiac pacemaker: Secondary | ICD-10-CM | POA: Diagnosis not present

## 2014-02-24 DIAGNOSIS — I498 Other specified cardiac arrhythmias: Secondary | ICD-10-CM | POA: Diagnosis not present

## 2014-02-24 DIAGNOSIS — R001 Bradycardia, unspecified: Secondary | ICD-10-CM

## 2014-02-24 DIAGNOSIS — H9190 Unspecified hearing loss, unspecified ear: Secondary | ICD-10-CM

## 2014-02-24 DIAGNOSIS — Z7189 Other specified counseling: Secondary | ICD-10-CM

## 2014-02-24 DIAGNOSIS — I10 Essential (primary) hypertension: Secondary | ICD-10-CM | POA: Diagnosis not present

## 2014-02-24 DIAGNOSIS — G309 Alzheimer's disease, unspecified: Secondary | ICD-10-CM | POA: Diagnosis not present

## 2014-02-24 DIAGNOSIS — E7800 Pure hypercholesterolemia, unspecified: Secondary | ICD-10-CM | POA: Diagnosis not present

## 2014-02-24 LAB — MDC_IDC_ENUM_SESS_TYPE_INCLINIC
Battery Remaining Longevity: >5
Brady Statistic RA Percent Paced: 1 %
Brady Statistic RV Percent Paced: 0 %
Implantable Pulse Generator Serial Number: 588589
Lead Channel Impedance Value: 640 Ohm
Lead Channel Impedance Value: 820 Ohm
Lead Channel Pacing Threshold Amplitude: 0.7 V
Lead Channel Pacing Threshold Pulse Width: 0.5 ms
Lead Channel Pacing Threshold Pulse Width: 0.5 ms
Lead Channel Sensing Intrinsic Amplitude: 2.9 mV
Lead Channel Setting Pacing Amplitude: 2 V
Lead Channel Setting Pacing Amplitude: 2.4 V
Lead Channel Setting Pacing Pulse Width: 0.5 ms
Lead Channel Setting Sensing Sensitivity: 2.5 mV
MDC IDC MSMT LEADCHNL RA PACING THRESHOLD AMPLITUDE: 0.8 V
MDC IDC MSMT LEADCHNL RV SENSING INTR AMPL: 10.7 mV

## 2014-02-24 MED ORDER — MEMANTINE HCL 28 X 5 MG & 21 X 10 MG PO TABS: ORAL_TABLET | ORAL | Status: DC

## 2014-02-24 NOTE — Assessment & Plan Note (Signed)
This patient is currently a Full code. She does have a living will and health care power of attorney documents. Briefly approached this topic with her nephew today, no changes made. Will require further discussion and education.

## 2014-02-24 NOTE — Assessment & Plan Note (Signed)
Over the last 6 months patient has exhibited decline in cognitive and functional status. MMSE declined from [id number] and 30, patient now requires assistance with multiple functional ADLs, is frequently incontinent of bowel and bladder. In addition she has some behavioral issues related to frustration, misinterpretations, likely sundowning. These changes represent progression of Alzheimer's dementia, recommend adding Namenda to help maintain functional status. This medication may also help with mood and behaviors. Extensive discussion today with the patient and her family regarding progression of this disease, interventions will be directed at maintaining her safety and dignity. This may include a moving from assisted living setting to the memory care setting. Diminished hearing likely is playing a part in her cognitive decline, recommend complete hearing evaluation. Also recommend eye exam as she has not had one in the last year.

## 2014-02-24 NOTE — Assessment & Plan Note (Signed)
Patient's hearing loss is affecting her communication abilities, likely adding to her frustration. Recommend complete hearing evaluation to determine if she is a candidate for hearing aids to improve function and communication
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
## 2014-02-24 NOTE — Patient Instructions (Addendum)
Your physician wants you to follow-up in: [place] DR. ALLRED You will receive a reminder letter in the mail two months in advance. If you don't receive a letter, please call our office to schedule the follow-up appointment.  Your Physician recommends you a a Device check in 6 moths

## 2014-02-24 NOTE — Progress Notes (Signed)
Patient ID: Sydney Arroyo, female   DOB: Jul 23, 1931, 80 y.o.   MRN: [id number]  [facility] [id number])  Code Status: Living Will. Full Code Contact Information   Name Relation Home Work Delia Other [place] Daughter [phone]        Chief Complaint  Patient presents with  . behavior    and mood changes.  Here with nephew Alvester Chou and Marlowe Kays    HPI: This is a 80 y.o. female resident of [place], [facility]isted [facility]  section.  Evaluation requested today do to recent behavior, mood and memory changes. Patient's nephew Alvester Chou and his wife are here today. They have noticed increased confusion and agitation; Mrs. Difrancesco calls him nightly worried because she doesn't know where she is, doesn't recognize her surroundings, not sure why she is here. Review of nursing record reveals increasing disorientation at times, patient has been noted to get lost within the building, she is having some language disturbance as well as difficulty identifying familiar objects. These difficulties have resulted in increased frustration and sometimes inappropriate behavior. Nursing staff reports patient is frequently difficult to redirect and reorient.  There is a concern that she is an elopement risk. Nursing documentation also reveals patient requires increased assistance with hygiene, dressing and organizing. Is frequently incontinent of both bowel and bladder. She does require multiple reminders for meals and medications and other activities. Recent MMSE (02/04/2014) 17/30 this represents a decline from 23/30 in October 2014. Clock with misrepresentation of 11:10.  Patient was evaluated by Dr. Judithann Graves, psychiatric consultant, November 2014. He diagnosed depression and prescribed sertraline 100 mg daily. Nursing staff reported that initially this medication was helpful in managing patient's frustration and agitation. Patient recently has been  more argumentative and has displayed some inappropriate behavior in group activities.  Recent visits: 08/2013 Alzheimer's disease 08/2013 MMSE 24/30 (0/3 recall, 2/5 orient time, 4/5 orient place), passed clock test. Staff reports increased confusion and disorientation resulting in angry outbursts. Pt.does not have insight re: STML.  Functional status is unchanged; pt remains independent with most ADLs, does require assist with whirlpool and requires medication management. Language skills are intact. Patient continues to go out of facility frequently with friends and family.   Last visit: CAP (community acquired pneumonia) Cough/ wheeze not improved after course of Avelox. Rt ear appears to have infection as well. Change to Augmentin, add diuretic for next 3 days to reduce mild pulmonary edema. Obtain lab in AM  Otitis media Rt ear pain, TM cloudy. Pt with chronic rhinitis, s/p sinusitis 1 month ago. Now also with PNA. Change antibiotic to Augmentin.  Since last visit patient's respiratory symptoms have resolved completely. She does continue to have some chronic postnasal drip she feels is managed adequately with current medications Patient returned to Dr. Maryjean Ka 1-2 months ago for a spinal injection to treat her chronic back pain. This intervention has been moderately successful. Patient continues to have some back pain though it does not limit her activity. She is ambulating with a walker at all times.  Patient had routine followup mammogram 12/15/2013, negative for malignancy.   Allergies  Allergen Reactions  . Demerol [Meperidine] Nausea And Vomiting  . Ketorolac Other (See Comments)    unknown  . Percocet [Oxycodone-Acetaminophen] Other (See Comments)    unknown  . Toradol [Ketorolac Tromethamine]    MEDICATION - Reviewed  DATA REVIEWED  Radiologic Studies  [facility] mobile x-ray 12/24/2013 CXR: No cardiomegaly, pulmonary  vascular congestion or effusion. Small area of atelectasis  or pneumonitis right lung base. No consolidation, no lung nodule  12/28/2013 Chest x-ray: Limited inspiration secondary patient's condition body habitus. Minimal cardiomegaly appears new with minimal pulmonary vascular congestion. A small left pleural effusion, new patchy atelectasis or pneumonitis at the lung bases worse on the right normal left.  Laboratory studies Lab Results- [facility] 09/17/2014  Component Value   WBC 7.2   HGB 13.9   HCT 41   PLT 194       GLUCOSE 102   CHOL 175   LDLCALC 87   ALT 21   AST 22   NA 140   K 3.8   CREATININE 0.7   BUN 16       TSH 2.24   Lab Results  Component Value Date   WBC 7.5 01/26/2014   HGB 14.0 01/26/2014   HCT 41 01/26/2014   MCV 94.5 12/18/2012   PLT 213 01/26/2014   Lab Results  Component Value Date   NA 139 01/26/2014   K 3.7 01/26/2014   GLU 101 01/26/2014   BUN 19 01/26/2014   CREATININE 0.9 01/26/2014     REVIEW OF SYSTEMS DATA OBTAINED: from patient, medical record, family members GENERAL: Feels well   No recent fever, fatigue, change in appetite or weight SKIN: No itch, rash or open wounds EYES: No eye pain, dryness or itching  No change in vision EARS: No earache, tinnitus,  Progressive decreased hearing noted by family "TV is turned up to 40" NOSE: No congestion, drainage or bleeding. chronic postnasal drip, manageable MOUTH/THROAT: No mouth or tooth pain  No sore throat   No difficulty chewing or swallowing RESPIRATORY: No cough, wheezing, SOB CARDIAC: No chest pain, palpitations  No edema. GI: No abdominal pain  No nausea, vomiting,diarrhea or constipation  No heartburn or reflux. Frequent incontinence GU: No dysuria, frequency or urgency  No change in urine volume or character Frequent incontinence MUSCULOSKELETAL: No joint pain, swelling or stiffness. Chronic back pain, better with use of walker.  No muscle ache, pain, weakness  Gait is steady with walker  No recent falls.  NEUROLOGIC: No dizziness, fainting, headache.  Progressive decline in memory and functional status, see history of present illness  PSYCHIATRIC: Patient has exhibited signs of anxiety, no obvious signs of depression. Has intermittent behavioral issues, see history of present illness Sleeps well.      PHYSICAL EXAM  Filed Vitals:   02/24/14 0901  BP: 132/74  Pulse: 74  Weight: 186 lb (84.369 kg)  SpO2: 95%   Body mass index is 35.16 kg/(m^2).  GENERAL APPEARANCE: No acute distress, appropriately groomed, Obese body habitus. Alert, pleasant, conversant. SKIN: No diaphoresis.    HEAD: Normocephalic, atraumatic EYES: Conjunctiva/lids clear. Pupils round, reactive.   EARS: External exam WNL,  Diminished Hearing  , not new NOSE: No deformity or discharge. MOUTH/THROAT: Lips w/o lesions. Oral mucosa, tongue moist, w/o lesion. Oropharynx w/o redness or lesions.   NECK: Supple, full ROM. No thyroid tenderness, enlargement or nodule LYMPHATICS: No head, neck or supraclavicular adenopathy RESPIRATORY: Breathing is even, unlabored. Lung sounds are clear and full.  CARDIOVASCULAR: Heart RRR. No murmur or extra heart sounds MUSCULOSKELETAL:  Gait is steady w/ walker NEUROLOGIC: Not oriented to time,oriented to place ([facility]), recognizes me as someone she has seen before does not identify as her medical provider. Speech is clear, no tremor. PSYCHIATRIC: Mood and affect appropriate to situation. Is very receptive today to discussion regarding her  memory and functional status  ASSESSMENT/PLAN  Alzheimer's disease Over the last 6 months patient has exhibited decline in cognitive and functional status. MMSE declined from 23 3217 and 30, patient now requires assistance with multiple functional ADLs, is frequently incontinent of bowel and bladder. In addition she has some behavioral issues related to frustration, misinterpretations, likely sundowning. These changes represent progression of Alzheimer's dementia, recommend adding Namenda to help  maintain functional status. This medication may also help with mood and behaviors. Extensive discussion today with the patient and her family regarding progression of this disease, interventions will be directed at maintaining her safety and dignity. This may include a moving from assisted living setting to the memory care setting. Diminished hearing likely is playing a part in her cognitive decline, recommend complete hearing evaluation. Also recommend eye exam as she has not had one in the last year.  Hearing loss Patient's hearing loss is affecting her communication abilities, likely adding to her frustration. Recommend complete hearing evaluation to determine if she is a candidate for hearing aids to improve function and communication  Advanced care planning/counseling discussion This patient is currently a Full code. She does have a living will and health care power of attorney documents. Briefly approached this topic with her nephew today, no changes made. Will require further discussion and education.   Time: 60 minutes, >50% spent counseling/or care coordination  Follow up: Return in about 3 weeks (around 03/17/2014) for As scheduled, Memory.   Mardene Celeste, NP-C [place] [phone]  02/24/2014

## 2014-03-10 DIAGNOSIS — H903 Sensorineural hearing loss, bilateral: Secondary | ICD-10-CM | POA: Diagnosis not present

## 2014-03-17 ENCOUNTER — Non-Acute Institutional Stay: Payer: Medicare Other | Admitting: Geriatric Medicine

## 2014-03-17 ENCOUNTER — Encounter: Payer: Self-pay | Admitting: Geriatric Medicine

## 2014-03-17 VITALS — BP 122/62 | HR 80 | Wt 189.0 lb

## 2014-03-17 DIAGNOSIS — G309 Alzheimer's disease, unspecified: Principal | ICD-10-CM

## 2014-03-17 DIAGNOSIS — H9190 Unspecified hearing loss, unspecified ear: Secondary | ICD-10-CM | POA: Diagnosis not present

## 2014-03-17 DIAGNOSIS — F329 Major depressive disorder, single episode, unspecified: Secondary | ICD-10-CM | POA: Diagnosis not present

## 2014-03-17 DIAGNOSIS — F32A Depression, unspecified: Secondary | ICD-10-CM

## 2014-03-17 DIAGNOSIS — F0280 Dementia in other diseases classified elsewhere without behavioral disturbance: Secondary | ICD-10-CM

## 2014-03-17 DIAGNOSIS — F3289 Other specified depressive episodes: Secondary | ICD-10-CM

## 2014-03-17 MED ORDER — ESCITALOPRAM OXALATE 10 MG PO TABS: 10.0000 mg | ORAL_TABLET | Freq: Every day | ORAL | Status: DC

## 2014-03-17 NOTE — Assessment & Plan Note (Signed)
Continue to monitor for impacted hearing loss on patient's mood and behavior. Consider hearing aid evaluation in the future

## 2014-03-17 NOTE — Assessment & Plan Note (Addendum)
Diagnosed by Dr. Remi Haggard, treated with sertraline. Recent ID genetics test reports this medication has potential metabolic interactions with multiple medications patient takes including donepezil, losartan, metoprolol, montelukast. Will change to escitalopram.

## 2014-03-17 NOTE — Assessment & Plan Note (Signed)
The patient is tolerating Namenda without difficulty. If any change related to this medication will be evident may take up to 12 weeks.

## 2014-03-17 NOTE — Progress Notes (Signed)
Patient ID: Sydney Arroyo, female   DOB: 07/11/1931, 80 y.o.   MRN: [id number]  [facility] [phone])  Code Status: Living Will. Full Code  Contact Information   Name Relation Home Work [place] Other [place] Daughter [phone]        Chief Complaint  Patient presents with  . Medical Management of Chronic Issues    blood pressure, Alzheimers    HPI: This is a 80 y.o. female resident of [place], Assisted Living section evaluated today for management of ongoing medical issues.    Recent visits: 09/16/2013 History of breast cancer Routine mammogram 05/2013 revealed some small calcifications in the left breast. Note to be likely degenerative fibroadenomas. Recommend repeat mammogram in 6 months (11/2013). Pure hypercholesterolemia Continues medication, update lab Spinal stenosis of lumbar region Patient has been seen by Dr. Clydell Hakim [facility]. She had a epidural steroid injection in early July 2014, she returned to Dr. Maryjean Ka July 30. Patient reported less pain with increased ability to walk. Return p.r.n. Patient reports pain remains so it is "not too bad". Continues BID dosing of naproxyn.  02/24/2014 Alzheimer's disease Over the last 6 months patient has exhibited decline in cognitive and functional status. MMSE declined from 23 -17/ 30, patient now requires assistance with multiple functional ADLs, is frequently incontinent of bowel and bladder. In addition she has some behavioral issues related to frustration, misinterpretations, likely sundowning. These changes represent progression of Alzheimer's dementia, recommend adding Namenda to help maintain functional status. This medication may also help with mood and behaviors. Extensive discussion today with the patient and her family regarding progression of this disease, interventions will be directed at maintaining her safety and  dignity. This may include a moving from assisted living setting to the memory care setting. Diminished hearing likely is playing a part in her cognitive decline, recommend complete hearing evaluation. Also recommend eye exam as she has not had one in the last year.  Hearing loss Patient's hearing loss is affecting her communication abilities, likely adding to her frustration. Recommend complete hearing evaluation to determine if she is a candidate for hearing aids to improve function and communication  Advanced care planning/counseling discussion This patient is currently a Full code. She does have a living will and health care power of attorney documents. Briefly approached this topic with her nephew today, no changes made. Will require further discussion and education.  Since last visit patient has tolerated titration of Namenda to full dose, no adverse effects. Review of facility record shows a few verbal outbursts in the last few weeks, no documentation regarding wandering or other unusual behavior. She has also undergone hearing evaluation, hearing loss was noted, hearing aid evaluation recommended. Patient and family declined. Eye exam set up for 03/31/2014 There's been no further discussion regarding advanced care planning. Mammogram was repeated in January 2015, no evidence of malignancy. Lab studies in October 2014 and March 2015 all satisfactory The patient continues to have intermittent back discomfort, use of walker is helpful. Patient reports she likes using a walker it helps her feel more stable. [facility] report has been received and reviewed.   Allergies  Allergen Reactions  . Demerol [Meperidine] Nausea And Vomiting  . Ketorolac Other (See Comments)    unknown  . Percocet [Oxycodone-Acetaminophen] Other (See Comments)    unknown  . Toradol [Ketorolac Tromethamine]    MEDICATION - Reviewed  DATA REVIEWED  Radiologic Studies  [facility] mobile x-ray 12/24/2013 CXR: No  cardiomegaly, pulmonary vascular congestion or effusion. Small area of atelectasis or pneumonitis right lung base. No consolidation, no lung nodule  12/28/2013 Chest x-ray: Limited inspiration secondary patient's condition body habitus. Minimal cardiomegaly appears new with minimal pulmonary vascular congestion. A small left pleural effusion, new patchy atelectasis or pneumonitis at the lung bases worse on the right normal left.  Laboratory studies Lab Results- [facility] 09/17/2014  Component Value   WBC 7.2   HGB 13.9   HCT 41   PLT 194       GLUCOSE 102   CHOL 175   LDLCALC 87   ALT 21   AST 22   NA 140   K 3.8   CREATININE 0.7   BUN 16       TSH 2.24   Lab Results  Component Value Date   WBC 7.5 01/26/2014   HGB 14.0 01/26/2014   HCT 41 01/26/2014   MCV 94.5 12/18/2012   PLT 213 01/26/2014   Lab Results  Component Value Date   NA 139 01/26/2014   K 3.7 01/26/2014   GLU 101 01/26/2014   BUN 19 01/26/2014   CREATININE 0.9 01/26/2014    REVIEW OF SYSTEMS DATA OBTAINED: from patient, medical record GENERAL: Feels well   No recent fever, fatigue, change in appetite or weight SKIN: No itch, rash or open wounds EYES: No eye pain, dryness or itching  No change in vision EARS: No earache, tinnitus,  patient denies hearing loss.  NOSE: No congestion, drainage or bleeding. chronic postnasal drip, manageable MOUTH/THROAT: No mouth or tooth pain  No sore throat   No difficulty chewing or swallowing RESPIRATORY: No cough, wheezing, SOB CARDIAC: No chest pain, palpitations  No edema. GI: No abdominal pain  No nausea, vomiting,diarrhea or constipation  No heartburn or reflux. Frequent incontinence GU: No dysuria, frequency or urgency  No change in urine volume or character Frequent incontinence MUSCULOSKELETAL: No joint pain, swelling or stiffness. Chronic back pain, better with use of walker.  No muscle ache, pain, weakness  Gait is steady with walker  No recent falls.  NEUROLOGIC: No dizziness,  fainting, headache. Progressive decline in memory and functional status, see history of present illness  PSYCHIATRIC: Patient has exhibited signs of anxiety, no obvious signs of depression. Has intermittent behavioral issues, see history of present illness Sleeps well.      PHYSICAL EXAM  Filed Vitals:   03/17/14 1113  BP: 122/62  Pulse: 80  Weight: 189 lb (85.73 kg)   Body mass index is 35.73 kg/(m^2).  GENERAL APPEARANCE: No acute distress, appropriately groomed, Obese body habitus. Alert, pleasant, conversant. SKIN: No diaphoresis.    HEAD: Normocephalic, atraumatic EYES: Conjunctiva/lids clear. Pupils round, reactive.   EARS: External exam WNL,  Diminished Hearing  , not new NOSE: No deformity or discharge. MOUTH/THROAT: Lips w/o lesions. Oral mucosa, tongue moist, w/o lesion. Oropharynx w/o redness or lesions.   NECK: Supple, full ROM. No thyroid tenderness, enlargement or nodule LYMPHATICS: No head, neck or supraclavicular adenopathy RESPIRATORY: Breathing is even, unlabored. Lung sounds are clear and full.  CARDIOVASCULAR: Heart RRR. No murmur or extra heart sounds MUSCULOSKELETAL:  Gait is steady w/ walker NEUROLOGIC: Not oriented to time,oriented to place ([facility]), recognizes me as someone she has seen before does not identify as her medical provider. Speech is clear, no tremor. PSYCHIATRIC: Mood and affect appropriate to situation. Is very receptive today to discussion regarding her memory and functional status  ASSESSMENT/PLAN  Alzheimer's disease The patient is tolerating Namenda without difficulty. If any change related to this medication will be evident may take up to 12 weeks.   Hearing loss Continue to monitor for impacted hearing loss on patient's mood and behavior. Consider hearing aid evaluation in the future  Depression Diagnosed by Dr. Remi Haggard, treated with sertraline. Recent ID [facility] test reports this medication has potential metabolic interactions  with multiple medications patient takes including donepezil, losartan, metoprolol, montelukast. Will change to escitalopram.   Time: 60 minutes, >50% spent counseling/or care coordination  Follow up: Return in about 3 months (around 06/16/2014) for OV, Memory, BP, back pain.   Mardene Celeste, NP-C [place] [phone]  03/17/2014

## 2014-03-25 ENCOUNTER — Encounter: Payer: Self-pay | Admitting: *Deleted

## 2014-03-31 DIAGNOSIS — H2510 Age-related nuclear cataract, unspecified eye: Secondary | ICD-10-CM | POA: Diagnosis not present

## 2014-03-31 DIAGNOSIS — H5210 Myopia, unspecified eye: Secondary | ICD-10-CM | POA: Diagnosis not present

## 2014-04-06 ENCOUNTER — Encounter: Payer: Self-pay | Admitting: Internal Medicine

## 2014-04-13 ENCOUNTER — Encounter: Payer: Self-pay | Admitting: Geriatric Medicine

## 2014-05-18 ENCOUNTER — Encounter: Payer: Self-pay | Admitting: Nurse Practitioner

## 2014-05-18 ENCOUNTER — Non-Acute Institutional Stay (SKILLED_NURSING_FACILITY): Payer: Medicare Other | Admitting: Nurse Practitioner

## 2014-05-18 DIAGNOSIS — F3289 Other specified depressive episodes: Secondary | ICD-10-CM | POA: Diagnosis not present

## 2014-05-18 DIAGNOSIS — I10 Essential (primary) hypertension: Secondary | ICD-10-CM

## 2014-05-18 DIAGNOSIS — G309 Alzheimer's disease, unspecified: Secondary | ICD-10-CM | POA: Diagnosis not present

## 2014-05-18 DIAGNOSIS — K219 Gastro-esophageal reflux disease without esophagitis: Secondary | ICD-10-CM | POA: Diagnosis not present

## 2014-05-18 DIAGNOSIS — F0280 Dementia in other diseases classified elsewhere without behavioral disturbance: Secondary | ICD-10-CM

## 2014-05-18 DIAGNOSIS — F32A Depression, unspecified: Secondary | ICD-10-CM

## 2014-05-18 DIAGNOSIS — M48061 Spinal stenosis, lumbar region without neurogenic claudication: Secondary | ICD-10-CM

## 2014-05-18 DIAGNOSIS — E7800 Pure hypercholesterolemia, unspecified: Secondary | ICD-10-CM

## 2014-05-18 DIAGNOSIS — F329 Major depressive disorder, single episode, unspecified: Secondary | ICD-10-CM | POA: Diagnosis not present
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
## 2014-05-25 ENCOUNTER — Other Ambulatory Visit: Payer: Self-pay | Admitting: Internal Medicine

## 2014-05-25 DIAGNOSIS — Z1231 Encounter for screening mammogram for malignant neoplasm of breast: Secondary | ICD-10-CM

## 2014-06-09 ENCOUNTER — Ambulatory Visit
Admission: RE | Admit: 2014-06-09 | Discharge: 2014-06-09 | Disposition: A | Discharge status: Home / Self Care | Payer: Medicare Other | Source: Ambulatory Visit | Attending: Internal Medicine | Admitting: Internal Medicine

## 2014-06-09 DIAGNOSIS — Z1231 Encounter for screening mammogram for malignant neoplasm of breast: Secondary | ICD-10-CM | POA: Diagnosis not present

## 2014-06-14 ENCOUNTER — Encounter: Payer: Self-pay | Admitting: Internal Medicine

## 2014-06-17 DIAGNOSIS — F0393 Unspecified dementia, unspecified severity, with mood disturbance: Secondary | ICD-10-CM | POA: Diagnosis not present

## 2014-06-17 DIAGNOSIS — F0390 Unspecified dementia without behavioral disturbance: Secondary | ICD-10-CM | POA: Diagnosis not present

## 2014-06-24 DIAGNOSIS — M545 Low back pain, unspecified: Secondary | ICD-10-CM | POA: Diagnosis not present

## 2014-06-29 ENCOUNTER — Non-Acute Institutional Stay (SKILLED_NURSING_FACILITY): Payer: Medicare Other | Admitting: Nurse Practitioner

## 2014-06-29 DIAGNOSIS — I10 Essential (primary) hypertension: Secondary | ICD-10-CM

## 2014-06-29 DIAGNOSIS — G309 Alzheimer's disease, unspecified: Secondary | ICD-10-CM | POA: Diagnosis not present

## 2014-06-29 DIAGNOSIS — F3289 Other specified depressive episodes: Secondary | ICD-10-CM

## 2014-06-29 DIAGNOSIS — M48061 Spinal stenosis, lumbar region without neurogenic claudication: Secondary | ICD-10-CM | POA: Diagnosis not present

## 2014-06-29 DIAGNOSIS — F329 Major depressive disorder, single episode, unspecified: Secondary | ICD-10-CM | POA: Diagnosis not present

## 2014-06-29 DIAGNOSIS — F32A Depression, unspecified: Secondary | ICD-10-CM

## 2014-06-29 DIAGNOSIS — F0280 Dementia in other diseases classified elsewhere without behavioral disturbance: Secondary | ICD-10-CM | POA: Diagnosis not present
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
## 2014-07-08 DIAGNOSIS — F0390 Unspecified dementia without behavioral disturbance: Secondary | ICD-10-CM | POA: Diagnosis not present

## 2014-07-08 DIAGNOSIS — F0393 Unspecified dementia, unspecified severity, with mood disturbance: Secondary | ICD-10-CM | POA: Diagnosis not present

## 2014-07-08 DIAGNOSIS — M25559 Pain in unspecified hip: Secondary | ICD-10-CM | POA: Diagnosis not present

## 2014-07-20 DIAGNOSIS — M545 Low back pain, unspecified: Secondary | ICD-10-CM | POA: Diagnosis not present

## 2014-07-20 DIAGNOSIS — M5430 Sciatica, unspecified side: Secondary | ICD-10-CM | POA: Diagnosis not present

## 2014-07-27 LAB — CBC AND DIFFERENTIAL
HCT: 43 % (ref 36–46)
Hemoglobin: 14.4 g/dL (ref 12.0–16.0)
Platelets: 216 10*3/uL (ref 150–399)
WBC: 10.5 10^3/mL

## 2014-07-27 LAB — BASIC METABOLIC PANEL
BUN: 24 mg/dL — AB (ref 4–21)
CREATININE: 0.7 mg/dL (ref 0.5–1.1)
Glucose: 98 mg/dL
POTASSIUM: 4.1 mmol/L (ref 3.4–5.3)
Sodium: 137 mmol/L (ref 137–147)

## 2014-07-27 LAB — HEPATIC FUNCTION PANEL
ALK PHOS: 68 U/L (ref 25–125)
ALT: 14 U/L (ref 7–35)
AST: 18 U/L (ref 13–35)
BILIRUBIN, TOTAL: 0.7 mg/dL

## 2014-08-13 ENCOUNTER — Encounter: Payer: Self-pay | Admitting: Internal Medicine

## 2014-08-13 DIAGNOSIS — Z299 Encounter for prophylactic measures, unspecified: Secondary | ICD-10-CM

## 2014-08-31 DIAGNOSIS — Z23 Encounter for immunization: Secondary | ICD-10-CM | POA: Diagnosis not present

## 2014-09-03 ENCOUNTER — Encounter: Payer: Self-pay | Admitting: *Deleted

## 2014-09-14 ENCOUNTER — Non-Acute Institutional Stay (SKILLED_NURSING_FACILITY): Payer: Medicare Other | Admitting: Nurse Practitioner

## 2014-09-14 ENCOUNTER — Encounter: Payer: Self-pay | Admitting: Nurse Practitioner

## 2014-09-14 DIAGNOSIS — R001 Bradycardia, unspecified: Secondary | ICD-10-CM

## 2014-09-14 DIAGNOSIS — I10 Essential (primary) hypertension: Secondary | ICD-10-CM | POA: Diagnosis not present

## 2014-09-14 DIAGNOSIS — M4806 Spinal stenosis, lumbar region: Secondary | ICD-10-CM

## 2014-09-14 DIAGNOSIS — F32A Depression, unspecified: Secondary | ICD-10-CM

## 2014-09-14 DIAGNOSIS — F329 Major depressive disorder, single episode, unspecified: Secondary | ICD-10-CM | POA: Diagnosis not present

## 2014-09-14 DIAGNOSIS — G309 Alzheimer's disease, unspecified: Secondary | ICD-10-CM

## 2014-09-14 DIAGNOSIS — F0280 Dementia in other diseases classified elsewhere without behavioral disturbance: Secondary | ICD-10-CM

## 2014-09-14 DIAGNOSIS — M48061 Spinal stenosis, lumbar region without neurogenic claudication: Secondary | ICD-10-CM
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
## 2014-09-20 NOTE — Progress Notes (Signed)
Patient ID: Sydney Arroyo, female   DOB: 07/20/1931, 80 y.o.   MRN: [id number] Opened in error

## 2014-09-22 ENCOUNTER — Encounter: Payer: Self-pay | Admitting: Nurse Practitioner

## 2014-10-06 ENCOUNTER — Encounter: Payer: Self-pay | Admitting: *Deleted

## 2014-10-25 ENCOUNTER — Non-Acute Institutional Stay: Payer: Medicare Other | Admitting: Adult Health

## 2014-10-25 DIAGNOSIS — F32A Depression, unspecified: Secondary | ICD-10-CM

## 2014-10-25 DIAGNOSIS — G309 Alzheimer's disease, unspecified: Secondary | ICD-10-CM | POA: Diagnosis not present

## 2014-10-25 DIAGNOSIS — I10 Essential (primary) hypertension: Secondary | ICD-10-CM

## 2014-10-25 DIAGNOSIS — F329 Major depressive disorder, single episode, unspecified: Secondary | ICD-10-CM

## 2014-10-25 DIAGNOSIS — R195 Other fecal abnormalities: Secondary | ICD-10-CM | POA: Diagnosis not present

## 2014-10-25 DIAGNOSIS — F0280 Dementia in other diseases classified elsewhere without behavioral disturbance: Secondary | ICD-10-CM

## 2014-10-25 NOTE — Progress Notes (Signed)
Patient ID: Sydney Arroyo, female   DOB: 01-24-1931, 80 y.o.   MRN: [id number]  Nursing Home Location:  [place] of Service: [id number] [phone])  Chief Complaint  Patient presents with  . Acute Visit    loose stools    HPI:  80 y.o. female residing at [facility], memory care unit. I was asked to see her today for chronic loose stools that have caused irritated skin around her rectum. This has been going on for about 6 mths. She was placed on protonix 6 mths ago for indigestion while taking an NSAID for OA pain. She has not had any fever, abd pain, n, v, or loss of appetite. The NSAID was discontinued.  She reports that she feels well today. The nurse reports today that this is an intermittent issue and that barrier cream has helped the redness to her bottom.  She has dementia and currently is ambulatory with a walker and continent of urine. She has incontinence with stools at times. Her BP has been stable over the past month, as well as her weight at 184 lbs.  Review of Systems:  Review of Systems  Constitutional: Negative for fever, chills, diaphoresis, activity change, appetite change and unexpected weight change.  HENT: Negative for postnasal drip, rhinorrhea, sore throat and trouble swallowing.   Respiratory: Negative for cough, shortness of breath and wheezing.   Gastrointestinal: Positive for diarrhea. Negative for nausea, vomiting, abdominal pain, abdominal distention and rectal pain.       Continent of urine, intermittently incontinent of stool  Genitourinary: Negative for dysuria and difficulty urinating.  Musculoskeletal: Positive for arthralgias and gait problem.       Uses walker  Neurological: Negative for dizziness, seizures, facial asymmetry and speech difficulty.  Psychiatric/Behavioral: Positive for behavioral problems, confusion and agitation. Negative for hallucinations. The patient is nervous/anxious.      Medications: Patient's Medications  New Prescriptions   No medications on file  Previous Medications   ACETAMINOPHEN (TYLENOL) 325 MG TABLET    Take 650 mg by mouth. Take two tablets twice a day for chronic pain. May have two additional tabs daily as needed for pain -NOT to exceed 3 grams a day   AMLODIPINE (NORVASC) 5 MG TABLET    Take 5 mg by mouth daily.   AMOXICILLIN (AMOXIL) 500 MG TABLET    Take 500 mg by mouth. Take 4 tablets one hour prior to dental   COLESEVELAM (WELCHOL) 625 MG TABLET    Take by mouth daily. Take one tablet daily for cholesterol   DONEPEZIL (ARICEPT) 10 MG TABLET    Take 10 mg by mouth at bedtime as needed.   ESCITALOPRAM (LEXAPRO) 10 MG TABLET    Take 1 tablet (10 mg total) by mouth daily.   FEXOFENADINE (ALLEGRA) 180 MG TABLET    Take 180 mg by mouth. Take one daily as needed   FLUTICASONE (FLONASE) 50 MCG/ACT NASAL SPRAY    Place 1 spray into the nose daily as needed. For sinus problems   LOSARTAN-HYDROCHLOROTHIAZIDE (HYZAAR) 100-25 MG PER TABLET    Take 1 tablet by mouth daily.   MECLIZINE (ANTIVERT) 25 MG TABLET    Take 25 mg by mouth. Take one tablet every 8 hours as needed for vertigo   MEMANTINE HCL ER (NAMENDA XR) 28 MG CP24    Take by mouth. Take one tablet each morning for memory   METOPROLOL SUCCINATE (TOPROL-XL) 25 MG 24 HR TABLET  Take 25 mg by mouth daily.   MONTELUKAST (SINGULAIR) 10 MG TABLET    Take 10 mg by mouth at bedtime.   PANTOPRAZOLE (PROTONIX) 40 MG TABLET    Take 40 mg by mouth daily.   ROPINIROLE (REQUIP) 0.5 MG TABLET    Take 0.5 mg by mouth at bedtime.  Modified Medications   No medications on file  Discontinued Medications   No medications on file     Physical Exam:  Filed Vitals:   10/25/14 1129  BP: 101/64  Pulse: 95  Temp: 97 F (36.1 C)  Resp: 16  Weight: 184 lb (83.462 kg)  SpO2: 95%    Physical Exam  Constitutional: She is well-developed, well-nourished, and in no distress.  HENT:  Head: Normocephalic  and atraumatic.  Neck: Normal range of motion. Neck supple.  Cardiovascular: Normal rate and regular rhythm.   No murmur heard. Pulmonary/Chest: Effort normal and breath sounds normal. No respiratory distress.  Abdominal: Soft. Bowel sounds are normal. She exhibits no distension and no mass. There is no tenderness.  Neurological: She is alert. No cranial nerve deficit.  Pleasant, f/c, oriented to self and situation  Skin: Skin is warm and dry. There is erythema.  Mild erythema to surrounding skin of rectum  Psychiatric: Affect normal.    Labs reviewed/Significant Diagnostic Results:  Basic Metabolic Panel:  Recent Labs  01/26/14 07/27/14  NA 139 137  K 3.7 4.1  BUN 19 24*  CREATININE 0.9 0.7   Liver Function Tests:  Recent Labs  07/27/14  AST 18  ALT 14  ALKPHOS 68   No results for input(s): LIPASE, AMYLASE in the last 8760 hours. No results for input(s): AMMONIA in the last 8760 hours. CBC:  Recent Labs  01/26/14 07/27/14  WBC 7.5 10.5  HGB 14.0 14.4  HCT 41 43  PLT 213 216    Assessment/Plan Loose stools D/C protonix and see if this improves her symptoms. It appears to be a rather chronic complaint and she does not appear ill. Bland diet and not spicy food were also recommended  Alzheimer's disease Continues to ambulate with a walker and be continent of urine. There are reports of anxiety at night.  Continue Aricept and Namenda and monitor for functional decline. If loose stools continue would consider trial off of Aricept  Hypertension BP stable at this time. No side effects from current regimen. Continue and monitor. BMP with next visit.  Depression Currently on Lexapro with reports of anxiety in the evening. Would not taper at this time.    [name], ANP [facility] [phone]

## 2014-10-27 ENCOUNTER — Encounter: Payer: Self-pay | Admitting: Adult Health

## 2014-10-27 DIAGNOSIS — R195 Other fecal abnormalities: Secondary | ICD-10-CM | POA: Insufficient documentation

## 2014-10-27 NOTE — Assessment & Plan Note (Signed)
BP stable at this time. No side effects from current regimen. Continue and monitor. BMP with next visit.

## 2014-10-27 NOTE — Assessment & Plan Note (Signed)
Currently on Lexapro with reports of anxiety in the evening. Would not taper at this time.

## 2014-10-27 NOTE — Assessment & Plan Note (Addendum)
Continues to ambulate with a walker and be continent of urine. There are reports of anxiety at night.  Continue Aricept and Namenda and monitor for functional decline. If loose stools continue would consider trial off of Aricept

## 2014-10-27 NOTE — Assessment & Plan Note (Signed)
D/C protonix and see if this improves her symptoms. It appears to be a rather chronic complaint and she does not appear ill. Bland diet and not spicy food were also recommended

## 2014-11-23 ENCOUNTER — Encounter: Payer: Self-pay | Admitting: *Deleted

## 2014-11-30 DIAGNOSIS — B351 Tinea unguium: Secondary | ICD-10-CM | POA: Diagnosis not present

## 2014-11-30 DIAGNOSIS — L84 Corns and callosities: Secondary | ICD-10-CM | POA: Diagnosis not present

## 2014-12-24 ENCOUNTER — Encounter: Payer: Self-pay | Admitting: *Deleted

## 2014-12-28 ENCOUNTER — Encounter: Payer: Self-pay | Admitting: Adult Health

## 2014-12-28 ENCOUNTER — Non-Acute Institutional Stay: Payer: Medicare Other | Admitting: Adult Health

## 2014-12-28 DIAGNOSIS — Z853 Personal history of malignant neoplasm of breast: Secondary | ICD-10-CM | POA: Diagnosis not present

## 2014-12-28 DIAGNOSIS — E7800 Pure hypercholesterolemia, unspecified: Secondary | ICD-10-CM

## 2014-12-28 DIAGNOSIS — F32A Depression, unspecified: Secondary | ICD-10-CM

## 2014-12-28 DIAGNOSIS — I10 Essential (primary) hypertension: Secondary | ICD-10-CM | POA: Diagnosis not present

## 2014-12-28 DIAGNOSIS — F329 Major depressive disorder, single episode, unspecified: Secondary | ICD-10-CM | POA: Diagnosis not present

## 2014-12-28 DIAGNOSIS — M4806 Spinal stenosis, lumbar region: Secondary | ICD-10-CM

## 2014-12-28 DIAGNOSIS — G309 Alzheimer's disease, unspecified: Secondary | ICD-10-CM

## 2014-12-28 DIAGNOSIS — F0280 Dementia in other diseases classified elsewhere without behavioral disturbance: Secondary | ICD-10-CM

## 2014-12-28 DIAGNOSIS — R001 Bradycardia, unspecified: Secondary | ICD-10-CM

## 2014-12-28 DIAGNOSIS — M48061 Spinal stenosis, lumbar region without neurogenic claudication: Secondary | ICD-10-CM

## 2014-12-28 NOTE — Progress Notes (Signed)
Patient ID: Sydney Arroyo, female   DOB: [birth year], 81 y.o.   MRN: [id number]  Nursing Home Location:  [place] of Service: ALF [phone])  Chief Complaint  Patient presents with  . Medical Management of Chronic Issues    HPI:  81 y.o. female residing at [facility], memory care unit. I am here to review her chronic medical issues. She has a hx of AD, vertigo, HTN, chronic pain due to spinal stenosis, bradycardia s/p dual pacemaker, breast cancer, and HLD. There are no issues concerning her care today. She has gained 5 lbs over the past few months and the staff says that she forgets that she has eaten and then eats again. She continues to remain ambulatory with a walker and communicative and is pleasant for our visit.   Review of Systems:  Review of Systems  Constitutional: Positive for unexpected weight change. Negative for fever, chills, diaphoresis, activity change and appetite change.  HENT: Negative for postnasal drip, rhinorrhea, sore throat and trouble swallowing.   Respiratory: Negative for cough, shortness of breath and wheezing.   Cardiovascular: Negative for chest pain, palpitations and leg swelling.  Gastrointestinal: Negative for nausea, vomiting, abdominal pain, diarrhea, abdominal distention and rectal pain.       Continent of urine, intermittently incontinent of stool  Genitourinary: Negative for dysuria and difficulty urinating.  Musculoskeletal: Positive for arthralgias and gait problem.       Uses walker  Neurological: Negative for dizziness, seizures, facial asymmetry and speech difficulty.  Psychiatric/Behavioral: Positive for confusion. Negative for hallucinations, behavioral problems and agitation. The patient is nervous/anxious.     Medications: Patient's Medications  New Prescriptions   No medications on file  Previous Medications   ACETAMINOPHEN (TYLENOL) 325 MG TABLET    Take 650 mg by mouth. Take two  tablets twice a day for chronic pain. May have two additional tabs daily as needed for pain -NOT to exceed 3 grams a day   AMLODIPINE (NORVASC) 5 MG TABLET    Take 5 mg by mouth daily.   AMOXICILLIN (AMOXIL) 500 MG TABLET    Take 500 mg by mouth. Take 4 tablets one hour prior to dental   COLESEVELAM (WELCHOL) 625 MG TABLET    Take by mouth daily. Take one tablet daily for cholesterol   DONEPEZIL (ARICEPT) 10 MG TABLET    Take 10 mg by mouth at bedtime as needed.   ESCITALOPRAM (LEXAPRO) 10 MG TABLET    Take 1 tablet (10 mg total) by mouth daily.   FEXOFENADINE (ALLEGRA) 180 MG TABLET    Take 180 mg by mouth. Take one daily as needed   FLUTICASONE (FLONASE) 50 MCG/ACT NASAL SPRAY    Place 1 spray into the nose daily as needed. For sinus problems   LOSARTAN-HYDROCHLOROTHIAZIDE (HYZAAR) 100-25 MG PER TABLET    Take 1 tablet by mouth daily.   MECLIZINE (ANTIVERT) 25 MG TABLET    Take 25 mg by mouth. Take one tablet every 8 hours as needed for vertigo   MEMANTINE HCL ER (NAMENDA XR) 28 MG CP24    Take by mouth. Take one tablet each morning for memory   METOPROLOL SUCCINATE (TOPROL-XL) 25 MG 24 HR TABLET    Take 25 mg by mouth daily.   MONTELUKAST (SINGULAIR) 10 MG TABLET    Take 10 mg by mouth at bedtime.   PANTOPRAZOLE (PROTONIX) 40 MG TABLET    Take 40 mg by mouth daily.  ROPINIROLE (REQUIP) 0.5 MG TABLET    Take 0.5 mg by mouth at bedtime.  Modified Medications   No medications on file  Discontinued Medications   No medications on file     Physical Exam:  Filed Vitals:   12/28/14 1046  BP: 119/66  Pulse: 80  Temp: 97.7 F (36.5 C)  Resp: 16  Weight: 189 lb 6.4 oz (85.911 kg)  SpO2: 97%   10/26/14: MMSE 20/30, failed clock  Physical Exam  Constitutional: She is well-developed, well-nourished, and in no distress.  HENT:  Head: Normocephalic and atraumatic.  Neck: Normal range of motion. Neck supple.  Cardiovascular: Normal rate and regular rhythm.   No murmur  heard. Pulmonary/Chest: Effort normal and breath sounds normal. No respiratory distress.  Abdominal: Soft. Bowel sounds are normal. She exhibits no distension and no mass. There is no tenderness.  rotund  Neurological: She is alert. No cranial nerve deficit.  Pleasant, f/c, oriented to self and situation  Skin: Skin is warm and dry. No erythema.  Extra dry skin to BLE  Psychiatric: Affect normal.    Labs reviewed/Significant Diagnostic Results:  Basic Metabolic Panel:  Recent Labs  01/26/14 07/27/14  NA 139 137  K 3.7 4.1  BUN 19 24*  CREATININE 0.9 0.7   Liver Function Tests:  Recent Labs  07/27/14  AST 18  ALT 14  ALKPHOS 68   No results for input(s): LIPASE, AMYLASE in the last 8760 hours. No results for input(s): AMMONIA in the last 8760 hours. CBC:  Recent Labs  01/26/14 07/27/14  WBC 7.5 10.5  HGB 14.0 14.4  HCT 41 43  PLT 213 216    Assessment/Plan  1. Alzheimer's disease MMSE remains stable over the past two years, continues on Aricept and Namenda. Continue current meds.   2. Spinal stenosis of lumbar region Stable on scheduled Tylenol, no complaints of pain today, ambulatory with walker  3. Pure hypercholesterolemia Check Lipids, continue Welchol, due to see cardiology, would consider discontinuing due to age and debility  4. Bradycardia S/P pacemaker, no symptoms, due for pacemaker check, RN to schedule apt  5. Depression Stable, continue Lexapro. Has prn ativan and has not used in the past 14 days  6. Essential hypertension Stable, continue current meds  7. H/O of breast cancer Mammogram in 2015 normal, would not continue to monitor mammography given age and dementia, RN will f/u with family  LABS: CBC, CMP, Lipids, TSH, Vit D next draw  [name], [place] [phone]

## 2014-12-30 DIAGNOSIS — M810 Age-related osteoporosis without current pathological fracture: Secondary | ICD-10-CM | POA: Diagnosis not present

## 2014-12-30 DIAGNOSIS — E780 Pure hypercholesterolemia: Secondary | ICD-10-CM | POA: Diagnosis not present

## 2014-12-30 DIAGNOSIS — I10 Essential (primary) hypertension: Secondary | ICD-10-CM | POA: Diagnosis not present

## 2015-01-05 LAB — CBC AND DIFFERENTIAL
HCT: 39 % (ref 36–46)
Hemoglobin: 13.5 g/dL (ref 12.0–16.0)
Platelets: 202 10*3/uL (ref 150–399)
WBC: 7.7 10^3/mL

## 2015-01-05 LAB — LIPID PANEL
Cholesterol: 187 mg/dL (ref 0–200)
HDL: 43 mg/dL (ref 35–70)
LDL Cholesterol: 86 mg/dL
TRIGLYCERIDES: 288 mg/dL — AB (ref 40–160)

## 2015-01-05 LAB — HEPATIC FUNCTION PANEL
ALK PHOS: 62 U/L (ref 25–125)
ALT: 13 U/L (ref 7–35)
AST: 17 U/L (ref 13–35)

## 2015-01-05 LAB — TSH: TSH: 1.91 u[IU]/mL (ref 0.41–5.90)

## 2015-01-05 LAB — BASIC METABOLIC PANEL
BUN: 14 mg/dL (ref 4–21)
CREATININE: 0.7 mg/dL (ref 0.5–1.1)
Potassium: 3.7 mmol/L (ref 3.4–5.3)
Sodium: 138 mmol/L (ref 137–147)

## 2015-03-02 ENCOUNTER — Encounter: Payer: Self-pay | Admitting: Internal Medicine

## 2015-03-02 ENCOUNTER — Ambulatory Visit (INDEPENDENT_AMBULATORY_CARE_PROVIDER_SITE_OTHER): Payer: Medicare Other | Admitting: Internal Medicine

## 2015-03-02 VITALS — BP 106/64 | HR 71 | Ht 65.0 in | Wt 185.0 lb

## 2015-03-02 DIAGNOSIS — I4891 Unspecified atrial fibrillation: Secondary | ICD-10-CM

## 2015-03-02 DIAGNOSIS — I495 Sick sinus syndrome: Secondary | ICD-10-CM

## 2015-03-02 DIAGNOSIS — I10 Essential (primary) hypertension: Secondary | ICD-10-CM | POA: Diagnosis not present

## 2015-03-02 LAB — MDC_IDC_ENUM_SESS_TYPE_INCLINIC
Brady Statistic RA Percent Paced: 0 %
Brady Statistic RV Percent Paced: 1 %
Date Time Interrogation Session: 20160406040000
Lead Channel Impedance Value: 770 Ohm
Lead Channel Pacing Threshold Amplitude: 0.8 V
Lead Channel Pacing Threshold Amplitude: 0.8 V
Lead Channel Setting Pacing Amplitude: 2 V
Lead Channel Setting Pacing Amplitude: 2.4 V
Lead Channel Setting Pacing Pulse Width: 0.5 ms
MDC IDC MSMT LEADCHNL RA IMPEDANCE VALUE: 640 Ohm
MDC IDC MSMT LEADCHNL RA PACING THRESHOLD PULSEWIDTH: 0.5 ms
MDC IDC MSMT LEADCHNL RA SENSING INTR AMPL: 3 mV — AB
MDC IDC MSMT LEADCHNL RV PACING THRESHOLD PULSEWIDTH: 0.5 ms
MDC IDC MSMT LEADCHNL RV SENSING INTR AMPL: 12 mV — AB
MDC IDC PG SERIAL: 588589
MDC IDC SET LEADCHNL RV SENSING SENSITIVITY: 2.5 mV
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
## 2015-03-02 NOTE — Patient Instructions (Signed)
Your physician wants you to follow-up in: 6 months with the device clinic & 1 year with Chanetta Marshall, NP for Dr. Rayann Heman. You will receive a reminder letter in the mail two months in advance. If you don't receive a letter, please call our office to schedule the follow-up appointment.  Your physician recommends that you continue on your current medications as directed. Please refer to the Current Medication list given to you today.

## 2015-03-14 ENCOUNTER — Encounter: Payer: Self-pay | Admitting: Adult Health

## 2015-03-14 ENCOUNTER — Non-Acute Institutional Stay: Payer: Medicare Other | Admitting: Adult Health

## 2015-03-14 DIAGNOSIS — F0280 Dementia in other diseases classified elsewhere without behavioral disturbance: Secondary | ICD-10-CM

## 2015-03-14 DIAGNOSIS — M4806 Spinal stenosis, lumbar region: Secondary | ICD-10-CM | POA: Diagnosis not present

## 2015-03-14 DIAGNOSIS — I10 Essential (primary) hypertension: Secondary | ICD-10-CM | POA: Diagnosis not present

## 2015-03-14 DIAGNOSIS — F329 Major depressive disorder, single episode, unspecified: Secondary | ICD-10-CM | POA: Diagnosis not present

## 2015-03-14 DIAGNOSIS — E7800 Pure hypercholesterolemia, unspecified: Secondary | ICD-10-CM

## 2015-03-14 DIAGNOSIS — G309 Alzheimer's disease, unspecified: Secondary | ICD-10-CM | POA: Diagnosis not present

## 2015-03-14 DIAGNOSIS — M48061 Spinal stenosis, lumbar region without neurogenic claudication: Secondary | ICD-10-CM

## 2015-03-14 DIAGNOSIS — F32A Depression, unspecified: Secondary | ICD-10-CM

## 2015-03-14 LAB — BASIC METABOLIC PANEL
BUN: 16 mg/dL (ref 4–21)
Creatinine: 0.8 mg/dL (ref 0.5–1.1)
Glucose: 113 mg/dL
POTASSIUM: 3.9 mmol/L (ref 3.4–5.3)
SODIUM: 134 mmol/L — AB (ref 137–147)

## 2015-03-14 LAB — CBC AND DIFFERENTIAL
HEMATOCRIT: 42 % (ref 36–46)
HEMOGLOBIN: 14.2 g/dL (ref 12.0–16.0)
Platelets: 202 10*3/uL (ref 150–399)
WBC: 9.4 10^3/mL

## 2015-03-14 LAB — HEPATIC FUNCTION PANEL
ALT: 17 U/L (ref 7–35)
AST: 19 U/L (ref 13–35)
Alkaline Phosphatase: 65 U/L (ref 25–125)
Bilirubin, Total: 0.6 mg/dL

## 2015-03-14 NOTE — Progress Notes (Signed)
Patient ID: TAHLOR BERENGUER, female   DOB: December 13, 1930, 81 y.o.   MRN: [id number]    Nursing Home Location:  [place] of Service: ALF [phone])  Chief Complaint  Patient presents with  . Acute Visit    anxiety, not participating in activities    HPI:  81 y.o. female residing at [facility], memory care unit. I was asked to see her due to increased anxiety, agitation with staff, crying, expresses sadness to the staff, and decreased participation in activities. She has used prn ativan 4 x in the past two weeks for agitation. She has had periods of wandering and wanting to return "home".  She has expressed feeling useless. When questioned about these things she denies them. She reports that she has had lost of pain in her low back and down into her left leg and this is why she has not been feeling well. She has not expressed this to the staff per the notes. She has a hx of spinal stenosis and OA, and uses scheduled Tylenol for pain. She has dementia and is a poor historian regarding her care  I am also here to review her chronic medical issues. She has a hx of AD, vertigo, HTN, chronic pain due to spinal stenosis, bradycardia s/p dual pacemaker, breast cancer, and HLD. Her weight has remained stable. Functionally she is the same, ambulating with the walker, feeding herself, communicating her needs, etc.   Review of Systems:  Review of Systems  Constitutional: Negative for fever, chills, diaphoresis, activity change, appetite change and unexpected weight change.  HENT: Negative for postnasal drip, rhinorrhea, sore throat and trouble swallowing.   Respiratory: Negative for cough, shortness of breath and wheezing.   Cardiovascular: Negative for chest pain, palpitations and leg swelling.  Gastrointestinal: Negative for nausea, vomiting, abdominal pain, diarrhea, abdominal distention and rectal pain.       Continent of urine, intermittently incontinent of  stool  Genitourinary: Negative for dysuria and difficulty urinating.  Musculoskeletal: Positive for back pain, arthralgias and gait problem.       Uses walker  Neurological: Negative for dizziness, seizures, facial asymmetry and speech difficulty.  Psychiatric/Behavioral: Positive for confusion, dysphoric mood and agitation. Negative for hallucinations and behavioral problems. The patient is nervous/anxious.     Medications: Patient's Medications  New Prescriptions   No medications on file  Previous Medications   AMLODIPINE (NORVASC) 5 MG TABLET    Take 5 mg by mouth daily.   AMOXICILLIN (AMOXIL) 500 MG TABLET    Take 4 tablets by mouth one hour prior to dental   CHOLECALCIFEROL (VITAMIN D3) 2000 UNITS TABS    Take 1 tablet by mouth daily.   COLESEVELAM (WELCHOL) 625 MG TABLET    Take 625 mg by mouth daily. For cholesterol   DONEPEZIL (ARICEPT) 10 MG TABLET    Take 10 mg by mouth daily.    ESCITALOPRAM (LEXAPRO) 10 MG TABLET    Take 1 tablet (10 mg total) by mouth daily.   LORAZEPAM (ATIVAN) 0.5 MG TABLET    Take 0.25 mg by mouth every 6 (six) hours as needed for anxiety.   LOSARTAN-HYDROCHLOROTHIAZIDE (HYZAAR) 100-25 MG PER TABLET    Take 1 tablet by mouth daily.   MECLIZINE (ANTIVERT) 25 MG TABLET    Take 25 mg by mouth every 8 (eight) hours as needed (vertigo).    MEMANTINE HCL ER (NAMENDA XR) 28 MG CP24    Take 28 mg by mouth every  morning. For memory   METOPROLOL SUCCINATE (TOPROL-XL) 25 MG 24 HR TABLET    Take 25 mg by mouth daily.   MONTELUKAST (SINGULAIR) 10 MG TABLET    Take 10 mg by mouth at bedtime.   ROPINIROLE (REQUIP) 0.5 MG TABLET    Take 0.5 mg by mouth at bedtime.  Modified Medications   No medications on file  Discontinued Medications   ACETAMINOPHEN (TYLENOL) 325 MG TABLET    Take two tablets by mouth twice a day for chronic pain. May have two additional tablets by mouth daily as needed for pain -NOT to exceed 3 grams a day     Physical Exam:  Filed Vitals:    03/14/15 1303  BP: 137/72  Pulse: 74  Temp: 98.1 F (36.7 C)  Resp: 18  Weight: 187 lb 9.6 oz (85.095 kg)  SpO2: 94%   Wt Readings from Last 3 Encounters:  03/14/15 187 lb 9.6 oz (85.095 kg)  03/02/15 185 lb (83.915 kg)  12/28/14 189 lb 6.4 oz (85.911 kg)   10/26/14: MMSE 20/30, failed clock  Physical Exam  Constitutional: She is well-developed, well-nourished, and in no distress.  HENT:  Head: Normocephalic and atraumatic.  Neck: Normal range of motion. Neck supple.  Cardiovascular: Normal rate and regular rhythm.   No murmur heard. Pulmonary/Chest: Effort normal and breath sounds normal. No respiratory distress.  Abdominal: Soft. Bowel sounds are normal. She exhibits no distension and no mass. There is no tenderness.  rotund  Musculoskeletal: She exhibits no edema or tenderness.  No tenderness to the spine, negative straight leg raise  Neurological: She is alert. No cranial nerve deficit. Coordination normal.  Pleasant, f/c, oriented to self and situation. Uses walker  Skin: Skin is warm and dry. No erythema.  Extra dry skin to BLE  Psychiatric: Affect normal.    Labs reviewed/Significant Diagnostic Results:  Basic Metabolic Panel:  Recent Labs  07/27/14 01/05/15  NA 137 138  K 4.1 3.7  BUN 24* 14  CREATININE 0.7 0.7   Liver Function Tests:  Recent Labs  07/27/14 01/05/15  AST 18 17  ALT 14 13  ALKPHOS 68 62   No results for input(s): LIPASE, AMYLASE in the last 8760 hours. No results for input(s): AMMONIA in the last 8760 hours. CBC:  Recent Labs  07/27/14 01/05/15  WBC 10.5 7.7  HGB 14.4 13.5  HCT 43 39  PLT 216 202    Assessment/Plan  1. Essential hypertension Stable, continue current meds  2. Alzheimer's disease Functionally she is stable so she will need to continue Aricept and Namenda. She has some issues with behavioral disturbances. This could be due to back pain or another medical issue. I will check a CBC and CMP today. Her most  recent TSH was WNL. I am going to tx her pain and see if this improves her mood, if not will add another agent with the Lexapro  3. Pure hypercholesterolemia Stable, continue current meds. No aggressive med management due to her age and dementia  4. Depression Continue lexapro, if no improvement in her mood with pain control, consider adding another agent with the lexapro  5. Spinal stenosis of lumbar region Change the tylenol to 500mg  two tabs TID     [name], ANP [facility] [phone]

## 2015-04-04 DIAGNOSIS — G4751 Confusional arousals: Secondary | ICD-10-CM | POA: Diagnosis not present

## 2015-04-07 ENCOUNTER — Encounter: Payer: Self-pay | Admitting: Adult Health

## 2015-04-07 ENCOUNTER — Non-Acute Institutional Stay: Payer: Medicare Other | Admitting: Adult Health

## 2015-04-07 DIAGNOSIS — F329 Major depressive disorder, single episode, unspecified: Secondary | ICD-10-CM

## 2015-04-07 DIAGNOSIS — F32A Depression, unspecified: Secondary | ICD-10-CM

## 2015-04-07 DIAGNOSIS — G309 Alzheimer's disease, unspecified: Secondary | ICD-10-CM

## 2015-04-07 DIAGNOSIS — F0280 Dementia in other diseases classified elsewhere without behavioral disturbance: Secondary | ICD-10-CM

## 2015-04-07 NOTE — Progress Notes (Signed)
Patient ID: Sydney Arroyo, female   DOB: 04/19/1931, 81 y.o.   MRN: [id number]    Nursing Home Location:  [place] of Service: ALF [phone])  Chief Complaint  Patient presents with  . Acute Visit    agitation, sadness    HPI:  81 y.o. female residing at [facility], memory care unit. I was asked to see her due to increased anxiety, agitation with staff, crying, expresses sadness to the staff, and decreased participation in activities on 03/14/15.  She expressed back pain during our visit. In review of the records this was chronic in nature with a hx of spinal stenosis. She was ordered Tylenol 1000 mg TID. She has not reported pain to the nurse since that time. A CBC and BMP were ordered that showed no abnormalities.  She is ambulating well with a walker.  She continues to report that she wants to leave the facility and that she is sad. She has a flat affect much of the time which is a change for her. The staff has concern for depression.  Functionally she is the same, ambulating with the walker, feeding herself, communicating her needs, etc.  A urine was obtained by the staff on 04/04/15 due to increased episodes of incontinence and increased agitation in the afternoon. The urine was showed no pyruria but was positive for nitrites. She has not had a fever, foul urine, or dysuria.   She has been using ativan 0.25 mg each afternoon for agitation and expressing that she wants to go home.    Review of Systems:  Review of Systems  Constitutional: Negative for fever, chills, diaphoresis, activity change, appetite change and unexpected weight change.  HENT: Negative for postnasal drip, rhinorrhea, sore throat and trouble swallowing.   Respiratory: Negative for cough, shortness of breath and wheezing.   Cardiovascular: Negative for chest pain, palpitations and leg swelling.  Gastrointestinal: Negative for nausea, vomiting, abdominal pain, diarrhea,  abdominal distention and rectal pain.  Genitourinary: Negative for dysuria and difficulty urinating.       Increased incontinence  Musculoskeletal: Positive for back pain, arthralgias and gait problem.       Uses walker  Neurological: Negative for dizziness, seizures, facial asymmetry and speech difficulty.  Psychiatric/Behavioral: Positive for confusion, dysphoric mood and agitation. Negative for hallucinations and behavioral problems. The patient is nervous/anxious.     Medications: Patient's Medications  New Prescriptions   No medications on file  Previous Medications   AMLODIPINE (NORVASC) 5 MG TABLET    Take 5 mg by mouth daily.   AMOXICILLIN (AMOXIL) 500 MG TABLET    Take 4 tablets by mouth one hour prior to dental   CHOLECALCIFEROL (VITAMIN D3) 2000 UNITS TABS    Take 1 tablet by mouth daily.   COLESEVELAM (WELCHOL) 625 MG TABLET    Take 625 mg by mouth daily. For cholesterol   DONEPEZIL (ARICEPT) 10 MG TABLET    Take 10 mg by mouth daily.    LORAZEPAM (ATIVAN) 0.5 MG TABLET    Take 0.25 mg by mouth every 6 (six) hours as needed for anxiety.   LOSARTAN-HYDROCHLOROTHIAZIDE (HYZAAR) 100-25 MG PER TABLET    Take 1 tablet by mouth daily.   MECLIZINE (ANTIVERT) 25 MG TABLET    Take 25 mg by mouth every 8 (eight) hours as needed (vertigo).    MEMANTINE HCL ER (NAMENDA XR) 28 MG CP24    Take 28 mg by mouth every morning. For memory  METOPROLOL SUCCINATE (TOPROL-XL) 25 MG 24 HR TABLET    Take 25 mg by mouth daily.   MONTELUKAST (SINGULAIR) 10 MG TABLET    Take 10 mg by mouth at bedtime.   ROPINIROLE (REQUIP) 0.5 MG TABLET    Take 0.5 mg by mouth at bedtime.  Modified Medications   No medications on file  Discontinued Medications   ESCITALOPRAM (LEXAPRO) 10 MG TABLET    Take 1 tablet (10 mg total) by mouth daily.     Physical Exam:  Filed Vitals:   04/07/15 1337  BP: 132/72  Pulse: 82  Temp: 97.8 F (36.6 C)  Resp: 16  Weight: 187 lb (84.823 kg)  SpO2: 96%   Wt Readings  from Last 3 Encounters:  04/07/15 187 lb (84.823 kg)  03/14/15 187 lb 9.6 oz (85.095 kg)  03/02/15 185 lb (83.915 kg)   10/26/14: MMSE 20/30, failed clock  Physical Exam  Constitutional: She is well-developed, well-nourished, and in no distress.  HENT:  Head: Normocephalic and atraumatic.  Neck: Normal range of motion. Neck supple.  Cardiovascular: Normal rate and regular rhythm.   No murmur heard. Pulmonary/Chest: Effort normal and breath sounds normal. No respiratory distress.  Abdominal: Soft. Bowel sounds are normal. She exhibits no distension and no mass. There is no tenderness.  rotund  Musculoskeletal: She exhibits no edema or tenderness.  No tenderness to the spine, negative straight leg raise  Neurological: She is alert. No cranial nerve deficit. Coordination normal.  Pleasant, f/c, oriented to self and situation. Uses walker  Skin: Skin is warm and dry. No erythema.  Extra dry skin to BLE  Psychiatric: Affect normal.    Labs reviewed/Significant Diagnostic Results:  Basic Metabolic Panel:  Recent Labs  07/27/14 01/05/15 03/14/15  NA 137 138 134*  K 4.1 3.7 3.9  BUN 24* 14 16  CREATININE 0.7 0.7 0.8   Liver Function Tests:  Recent Labs  07/27/14 01/05/15 03/14/15  AST 18 17 19   ALT 14 13 17   ALKPHOS 68 62 65   No results for input(s): LIPASE, AMYLASE in the last 8760 hours. No results for input(s): AMMONIA in the last 8760 hours. CBC:  Recent Labs  07/27/14 01/05/15 03/14/15  WBC 10.5 7.7 9.4  HGB 14.4 13.5 14.2  HCT 43 39 42  PLT 216 202 202   Wt Readings from Last 3 Encounters:  04/07/15 187 lb (84.823 kg)  03/14/15 187 lb 9.6 oz (85.095 kg)  03/02/15 185 lb (83.915 kg)   Lab Results  Component Value Date   TSH 1.91 01/05/2015    Assessment/Plan 1. Depression Taper Lexapro to 5 mg daily for two weeks then d/c.  Begin Cymbalta 30mg  qd, with a plan to increased to 60 mg if tolerated.   2. Alzheimer's disease Continue aricept and namenda.  Her agitation and incontinence are most likely a progression of dementia, complicated by #1      [name], [place] [phone]

## 2015-04-27 DIAGNOSIS — H2513 Age-related nuclear cataract, bilateral: Secondary | ICD-10-CM | POA: Diagnosis not present

## 2015-05-20 ENCOUNTER — Non-Acute Institutional Stay: Payer: Medicare Other | Admitting: Adult Health

## 2015-05-20 DIAGNOSIS — G308 Other Alzheimer's disease: Secondary | ICD-10-CM

## 2015-05-20 DIAGNOSIS — F329 Major depressive disorder, single episode, unspecified: Secondary | ICD-10-CM

## 2015-05-20 DIAGNOSIS — F0281 Dementia in other diseases classified elsewhere with behavioral disturbance: Secondary | ICD-10-CM

## 2015-05-20 DIAGNOSIS — M4806 Spinal stenosis, lumbar region: Secondary | ICD-10-CM | POA: Diagnosis not present

## 2015-05-20 DIAGNOSIS — Z853 Personal history of malignant neoplasm of breast: Secondary | ICD-10-CM

## 2015-05-20 DIAGNOSIS — F32A Depression, unspecified: Secondary | ICD-10-CM

## 2015-05-20 DIAGNOSIS — G309 Alzheimer's disease, unspecified: Principal | ICD-10-CM

## 2015-05-20 DIAGNOSIS — M48061 Spinal stenosis, lumbar region without neurogenic claudication: Secondary | ICD-10-CM

## 2015-05-24 ENCOUNTER — Encounter: Payer: Self-pay | Admitting: Adult Health

## 2015-05-24 DIAGNOSIS — F0281 Dementia in other diseases classified elsewhere with behavioral disturbance: Secondary | ICD-10-CM | POA: Insufficient documentation

## 2015-05-24 DIAGNOSIS — G309 Alzheimer's disease, unspecified: Principal | ICD-10-CM

## 2015-05-24 NOTE — Progress Notes (Signed)
Patient ID: Sydney Arroyo, female   DOB: 05/25/1931, 81 y.o.   MRN: [id number]    Nursing Home Location:  [place] of Service: ALF [phone])  Chief Complaint  Patient presents with  . Medical Management of Chronic Issues    HPI:  81 y.o. female residing at [facility], memory care unit. I am here to review her chronic medical issues. She has a hx of SSS s/p pacemaker, AD, HTN, spinal stenosis, depression, depression, and breast ca.   The staff reports that she has been more agitated since our last visit. She was started on Ativan 0.25mg  one in the morning and two in the evening with mild improvement. She is delusional and thinks that she is a young girl living with her parents, currently learning to play the piano. She apparently can become very upset when she "can't find her parents".   She has a hx of breast ca 1997: Right breast lumpectomy with sentinel lymph node dissection followed by radiation chemotherapy.  Her mammogram for 2015 was negative with a recommendation to reassess in one year.  She remains verbal, ambulatory with a walker, and able to communicate her needs, currently on aricept and namenda.  She was started on Cymbalta at our last visit to treat for depression and any chronic pain that may be present due to spinal stenosis. Minimal response noted.   Review of Systems:  Review of Systems  Constitutional: Negative for fever, chills, diaphoresis, activity change, appetite change and unexpected weight change.  HENT: Negative for postnasal drip, rhinorrhea, sore throat and trouble swallowing.   Respiratory: Negative for cough, shortness of breath and wheezing.   Cardiovascular: Negative for chest pain, palpitations and leg swelling.  Gastrointestinal: Negative for nausea, vomiting, abdominal pain, diarrhea, abdominal distention and rectal pain.  Genitourinary: Negative for dysuria and difficulty urinating.       Increased  incontinence  Musculoskeletal: Positive for back pain, arthralgias and gait problem.       Uses walker  Neurological: Negative for dizziness, seizures, facial asymmetry and speech difficulty.  Psychiatric/Behavioral: Positive for confusion, dysphoric mood and agitation. Negative for hallucinations and behavioral problems. The patient is nervous/anxious.     Medications: Patient's Medications  New Prescriptions   No medications on file  Previous Medications   AMLODIPINE (NORVASC) 5 MG TABLET    Take 5 mg by mouth daily.   AMOXICILLIN (AMOXIL) 500 MG TABLET    Take 4 tablets by mouth one hour prior to dental   CHOLECALCIFEROL (VITAMIN D3) 2000 UNITS TABS    Take 1 tablet by mouth daily.   COLESEVELAM (WELCHOL) 625 MG TABLET    Take 625 mg by mouth daily. For cholesterol   DONEPEZIL (ARICEPT) 10 MG TABLET    Take 10 mg by mouth daily.    LORAZEPAM (ATIVAN) 0.5 MG TABLET    Take 0.25 mg by mouth every 6 (six) hours as needed for anxiety.   LOSARTAN-HYDROCHLOROTHIAZIDE (HYZAAR) 100-25 MG PER TABLET    Take 1 tablet by mouth daily.   MECLIZINE (ANTIVERT) 25 MG TABLET    Take 25 mg by mouth every 8 (eight) hours as needed (vertigo).    MEMANTINE HCL ER (NAMENDA XR) 28 MG CP24    Take 28 mg by mouth every morning. For memory   METOPROLOL SUCCINATE (TOPROL-XL) 25 MG 24 HR TABLET    Take 25 mg by mouth daily.   MONTELUKAST (SINGULAIR) 10 MG TABLET    Take 10  mg by mouth at bedtime.   QUETIAPINE (SEROQUEL) 50 MG TABLET    Take 50 mg by mouth at bedtime.   ROPINIROLE (REQUIP) 0.5 MG TABLET    Take 0.5 mg by mouth at bedtime.  Modified Medications   No medications on file  Discontinued Medications   No medications on file     Physical Exam:  Filed Vitals:   05/24/15 1422  BP: 116/77  Pulse: 86  Temp: 97 F (36.1 C)  Resp: 18  Weight: 185 lb (83.915 kg)  SpO2: 94%   Wt Readings from Last 3 Encounters:  05/24/15 185 lb (83.915 kg)  04/07/15 187 lb (84.823 kg)  03/14/15 187 lb 9.6 oz  (85.095 kg)   10/26/14: MMSE 20/30, failed clock  Physical Exam  Constitutional: She is well-developed, well-nourished, and in no distress.  HENT:  Head: Normocephalic and atraumatic.  Neck: Normal range of motion. Neck supple.  Cardiovascular: Normal rate and regular rhythm.   No murmur heard. Pulmonary/Chest: Effort normal and breath sounds normal. No respiratory distress.  Abdominal: Soft. Bowel sounds are normal. She exhibits no distension and no mass. There is no tenderness.  rotund  Musculoskeletal: She exhibits no edema or tenderness.  No tenderness to the spine, negative straight leg raise  Neurological: She is alert. No cranial nerve deficit. Coordination normal.  Pleasant, f/c, oriented to self and situation. Uses walker  Skin: Skin is warm and dry. No erythema.  Extra dry skin to BLE  Psychiatric: Affect normal.    Labs reviewed/Significant Diagnostic Results:  Basic Metabolic Panel:  Recent Labs  07/27/14 01/05/15 03/14/15  NA 137 138 134*  K 4.1 3.7 3.9  BUN 24* 14 16  CREATININE 0.7 0.7 0.8   Liver Function Tests:  Recent Labs  07/27/14 01/05/15 03/14/15  AST 18 17 19   ALT 14 13 17   ALKPHOS 68 62 65   No results for input(s): LIPASE, AMYLASE in the last 8760 hours. No results for input(s): AMMONIA in the last 8760 hours. CBC:  Recent Labs  07/27/14 01/05/15 03/14/15  WBC 10.5 7.7 9.4  HGB 14.4 13.5 14.2  HCT 43 39 42  PLT 216 202 202   Wt Readings from Last 3 Encounters:  05/24/15 185 lb (83.915 kg)  04/07/15 187 lb (84.823 kg)  03/14/15 187 lb 9.6 oz (85.095 kg)   Lab Results  Component Value Date   TSH 1.91 01/05/2015    Assessment/Plan  1. Alzheimer's dementia with behavioral disturbance Worsening delusional behavior. Continue aricept and namenda and ativan Begin seroquel 50 mg qhs  2. History of breast cancer I will speak to her family prior to the upcoming mammogram to discuss the fact that it may no longer be appropriate due to  her advanced dementia. She would most likely not be compliant with any therapy/RX required for breast ca if recurrance  3. Depression Continue Cymbalta at this time for any added benefit with pain control/agitation  4. Spinal stenosis of lumbar region Stable, continue Tylenol      [name], ANP [facility] [phone]

## 2015-06-07 ENCOUNTER — Non-Acute Institutional Stay: Payer: Medicare Other | Admitting: Internal Medicine

## 2015-06-07 ENCOUNTER — Encounter: Payer: Self-pay | Admitting: Internal Medicine

## 2015-06-07 DIAGNOSIS — M4806 Spinal stenosis, lumbar region: Secondary | ICD-10-CM | POA: Diagnosis not present

## 2015-06-07 DIAGNOSIS — R451 Restlessness and agitation: Secondary | ICD-10-CM

## 2015-06-07 DIAGNOSIS — G309 Alzheimer's disease, unspecified: Secondary | ICD-10-CM

## 2015-06-07 DIAGNOSIS — F329 Major depressive disorder, single episode, unspecified: Secondary | ICD-10-CM

## 2015-06-07 DIAGNOSIS — F32A Depression, unspecified: Secondary | ICD-10-CM

## 2015-06-07 DIAGNOSIS — F0281 Dementia in other diseases classified elsewhere with behavioral disturbance: Secondary | ICD-10-CM

## 2015-06-07 DIAGNOSIS — M48061 Spinal stenosis, lumbar region without neurogenic claudication: Secondary | ICD-10-CM

## 2015-06-07 DIAGNOSIS — G308 Other Alzheimer's disease: Secondary | ICD-10-CM | POA: Diagnosis not present

## 2015-06-07 MED ORDER — GABAPENTIN 100 MG PO CAPS: 100.0000 mg | ORAL_CAPSULE | Freq: Every day | ORAL | Status: DC

## 2015-06-07 MED ORDER — DULOXETINE HCL 60 MG PO CPEP: 60.0000 mg | ORAL_CAPSULE | Freq: Every day | ORAL | Status: DC
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
## 2015-06-09 ENCOUNTER — Encounter: Payer: Self-pay | Admitting: Adult Health

## 2015-06-09 DIAGNOSIS — G4751 Confusional arousals: Secondary | ICD-10-CM | POA: Diagnosis not present

## 2015-06-09 NOTE — Progress Notes (Signed)
This encounter was created in error - please disregard.

## 2015-06-16 DIAGNOSIS — E782 Mixed hyperlipidemia: Secondary | ICD-10-CM | POA: Diagnosis not present

## 2015-06-16 DIAGNOSIS — G4751 Confusional arousals: Secondary | ICD-10-CM | POA: Diagnosis not present

## 2015-06-16 LAB — CBC AND DIFFERENTIAL
HCT: 42 % (ref 36–46)
HEMOGLOBIN: 14.3 g/dL (ref 12.0–16.0)
Platelets: 198 10*3/uL (ref 150–399)
WBC: 7.1 10*3/mL

## 2015-06-16 LAB — HEPATIC FUNCTION PANEL
ALT: 19 U/L (ref 7–35)
AST: 22 U/L (ref 13–35)
Alkaline Phosphatase: 56 U/L (ref 25–125)
Bilirubin, Total: 0.5 mg/dL

## 2015-07-04 ENCOUNTER — Non-Acute Institutional Stay: Payer: Medicare Other | Admitting: Adult Health

## 2015-07-04 DIAGNOSIS — G308 Other Alzheimer's disease: Secondary | ICD-10-CM

## 2015-07-04 DIAGNOSIS — F02818 Dementia in other diseases classified elsewhere, unspecified severity, with other behavioral disturbance: Secondary | ICD-10-CM

## 2015-07-04 DIAGNOSIS — F0281 Dementia in other diseases classified elsewhere with behavioral disturbance: Secondary | ICD-10-CM

## 2015-07-04 DIAGNOSIS — G309 Alzheimer's disease, unspecified: Principal | ICD-10-CM

## 2015-07-04 DIAGNOSIS — M4806 Spinal stenosis, lumbar region: Secondary | ICD-10-CM

## 2015-07-04 DIAGNOSIS — M48061 Spinal stenosis, lumbar region without neurogenic claudication: Secondary | ICD-10-CM

## 2015-07-04 NOTE — Progress Notes (Signed)
Patient ID: NAIOMI MUSTO, female   DOB: 1931-08-12, 81 y.o.   MRN: [id number]    Nursing Home Location:  [place] of Service: ALF [phone])  Chief Complaint  Patient presents with  . Acute Visit    flat affect, minimal verbalization    HPI:  81 y.o. female residing at [facility], memory care unit. I am here to review her chronic medical issues. She has a hx of SSS s/p pacemaker, AD, HTN, spinal stenosis, depression, depression, and breast ca.   She has had issues with delusions that she is a young girl who can not find her parents. She has also tried to elope the facility and had other numerous issues with agitation. She was placed on Depakote 250mg  BID which was titrated up slowly. The staff reports that her behavior has improved but that now she is minimally verbal and has a flat affect. She has tried seroquel in the past without improvement.  Her neurontin which was started for leg and back pain was help over the weekend to see if this was the culprit but her behavior remained unchanged.    Review of Systems:  Review of Systems  Constitutional: Negative for fever, chills, diaphoresis, activity change, appetite change and unexpected weight change.  HENT: Negative for postnasal drip, rhinorrhea, sore throat and trouble swallowing.   Respiratory: Negative for cough, shortness of breath and wheezing.   Cardiovascular: Negative for chest pain, palpitations and leg swelling.  Gastrointestinal: Negative for nausea, vomiting, abdominal pain, diarrhea, abdominal distention and rectal pain.  Genitourinary: Negative for dysuria and difficulty urinating.       Increased incontinence  Musculoskeletal: Positive for back pain, arthralgias and gait problem.       Uses walker  Neurological: Negative for dizziness, seizures, facial asymmetry and speech difficulty.  Psychiatric/Behavioral: Positive for confusion, dysphoric mood and agitation. Negative  for hallucinations, behavioral problems and sleep disturbance. The patient is not nervous/anxious.     Medications: Patient's Medications  New Prescriptions   No medications on file  Previous Medications   AMLODIPINE (NORVASC) 5 MG TABLET    Take 5 mg by mouth daily.   AMOXICILLIN (AMOXIL) 500 MG TABLET    Take 4 tablets by mouth one hour prior to dental   CHOLECALCIFEROL (VITAMIN D3) 2000 UNITS TABS    Take 1 tablet by mouth daily.   COLESEVELAM (WELCHOL) 625 MG TABLET    Take 625 mg by mouth daily. For cholesterol   DIVALPROEX (DEPAKOTE SPRINKLE) 125 MG CAPSULE    Take 125 mg by mouth 2 (two) times daily. 125 mg in the am and 250 mg pm   DONEPEZIL (ARICEPT) 10 MG TABLET    Take 10 mg by mouth daily.    DULOXETINE (CYMBALTA) 60 MG CAPSULE    Take 1 capsule (60 mg total) by mouth daily.   GABAPENTIN (NEURONTIN) 100 MG CAPSULE    Take 1 capsule (100 mg total) by mouth at bedtime.   LOSARTAN-HYDROCHLOROTHIAZIDE (HYZAAR) 100-25 MG PER TABLET    Take 1 tablet by mouth daily.   MEMANTINE HCL ER (NAMENDA XR) 28 MG CP24    Take 28 mg by mouth every morning. For memory   METOPROLOL SUCCINATE (TOPROL-XL) 25 MG 24 HR TABLET    Take 25 mg by mouth daily.   MONTELUKAST (SINGULAIR) 10 MG TABLET    Take 10 mg by mouth at bedtime.   QUETIAPINE (SEROQUEL) 50 MG TABLET    Take 50  mg by mouth 2 (two) times daily.  Modified Medications   No medications on file  Discontinued Medications   No medications on file     Physical Exam:  Filed Vitals:   07/04/15 1445  BP: 118/50  Pulse: 53  Temp: 98.9 F (37.2 C)  Resp: 17  SpO2: 92%   Wt Readings from Last 3 Encounters:  06/07/15 183 lb 3.2 oz (83.099 kg)  05/24/15 185 lb (83.915 kg)  04/07/15 187 lb (84.823 kg)   10/26/14: MMSE 20/30, failed clock  Physical Exam  Constitutional: She is well-developed, well-nourished, and in no distress.  HENT:  Head: Normocephalic and atraumatic.  Neck: Normal range of motion. Neck supple.  Cardiovascular:  Normal rate and regular rhythm.   No murmur heard. Pulmonary/Chest: Effort normal and breath sounds normal. No respiratory distress.  Abdominal: Soft. Bowel sounds are normal. She exhibits no distension and no mass. There is no tenderness.  rotund  Musculoskeletal: She exhibits no edema or tenderness.  No tenderness to the spine, negative straight leg raise  Neurological: She is alert. No cranial nerve deficit. Coordination normal.  Oriented to self and able to f/c and answer q's.  Less verbal and less participatory in conversation  Skin: Skin is warm and dry. No erythema.  Extra dry skin to BLE  Psychiatric:  Flat affect today    Labs reviewed/Significant Diagnostic Results:  Basic Metabolic Panel:  Recent Labs  07/27/14 01/05/15 03/14/15  NA 137 138 134*  K 4.1 3.7 3.9  BUN 24* 14 16  CREATININE 0.7 0.7 0.8   Liver Function Tests:  Recent Labs  01/05/15 03/14/15 06/16/15  AST 17 19 22   ALT 13 17 19   ALKPHOS 62 65 56   No results for input(s): LIPASE, AMYLASE in the last 8760 hours. No results for input(s): AMMONIA in the last 8760 hours. CBC:  Recent Labs  01/05/15 03/14/15 06/16/15  WBC 7.7 9.4 7.1  HGB 13.5 14.2 14.3  HCT 39 42 42  PLT 202 202 198   Wt Readings from Last 3 Encounters:  06/07/15 183 lb 3.2 oz (83.099 kg)  05/24/15 185 lb (83.915 kg)  04/07/15 187 lb (84.823 kg)   Lab Results  Component Value Date   TSH 1.91 01/05/2015    Assessment/Plan  1. Alzheimer's dementia with behavioral disturbance -improved behavior in terms of agitation but less participatory in conversation and has a flat affect -her family has expressed concern to the staff regarding this issue, will reduce Depakote to 125 mg in the am and 250 mg in the pm. If she becomes more agitated again we may have to accept the risk of this sedative effects of this drug for the benefit of improved behaviors -Depakote level and BMP in am  2) Spinal stenosis -resume Neurontin as holding  this med did not seem to help her flat affect -she reported pain in her leg today   [name], [place] [phone]

## 2015-07-05 DIAGNOSIS — G4751 Confusional arousals: Secondary | ICD-10-CM | POA: Diagnosis not present

## 2015-07-05 DIAGNOSIS — I10 Essential (primary) hypertension: Secondary | ICD-10-CM | POA: Diagnosis not present

## 2015-07-05 LAB — BASIC METABOLIC PANEL
BUN: 17 mg/dL (ref 4–21)
Creatinine: 0.5 mg/dL (ref 0.5–1.1)
GLUCOSE: 94 mg/dL
POTASSIUM: 4.1 mmol/L (ref 3.4–5.3)
SODIUM: 137 mmol/L (ref 137–147)

## 2015-08-03 DIAGNOSIS — R509 Fever, unspecified: Secondary | ICD-10-CM | POA: Diagnosis not present

## 2015-08-09 DIAGNOSIS — R451 Restlessness and agitation: Secondary | ICD-10-CM | POA: Diagnosis not present

## 2015-08-26 DIAGNOSIS — L570 Actinic keratosis: Secondary | ICD-10-CM | POA: Diagnosis not present

## 2015-08-26 DIAGNOSIS — L578 Other skin changes due to chronic exposure to nonionizing radiation: Secondary | ICD-10-CM | POA: Diagnosis not present

## 2015-08-26 DIAGNOSIS — L814 Other melanin hyperpigmentation: Secondary | ICD-10-CM | POA: Diagnosis not present

## 2015-08-31 DIAGNOSIS — Z23 Encounter for immunization: Secondary | ICD-10-CM | POA: Diagnosis not present

## 2015-09-08 ENCOUNTER — Encounter: Payer: Self-pay | Admitting: Internal Medicine

## 2015-09-08 ENCOUNTER — Ambulatory Visit (INDEPENDENT_AMBULATORY_CARE_PROVIDER_SITE_OTHER): Payer: Medicare Other | Admitting: *Deleted

## 2015-09-08 DIAGNOSIS — I4891 Unspecified atrial fibrillation: Secondary | ICD-10-CM | POA: Diagnosis not present

## 2015-09-08 DIAGNOSIS — R001 Bradycardia, unspecified: Secondary | ICD-10-CM

## 2015-09-09 LAB — CUP PACEART INCLINIC DEVICE CHECK
Brady Statistic RV Percent Paced: 0 %
Date Time Interrogation Session: 20161013040000
Implantable Lead Implant Date: 20090618
Implantable Lead Location: 753860
Implantable Lead Model: 4087
Implantable Lead Serial Number: 288425
Lead Channel Impedance Value: 620 Ohm
Lead Channel Pacing Threshold Amplitude: 0.8 V
Lead Channel Sensing Intrinsic Amplitude: 12 mV
Lead Channel Sensing Intrinsic Amplitude: 3 mV
Lead Channel Setting Sensing Sensitivity: 2.5 mV
MDC IDC LEAD IMPLANT DT: 20090618
MDC IDC LEAD LOCATION: 753859
MDC IDC LEAD SERIAL: 253015
MDC IDC MSMT LEADCHNL RA PACING THRESHOLD PULSEWIDTH: 0.5 ms
MDC IDC MSMT LEADCHNL RV IMPEDANCE VALUE: 890 Ohm
MDC IDC MSMT LEADCHNL RV PACING THRESHOLD AMPLITUDE: 0.5 V
MDC IDC MSMT LEADCHNL RV PACING THRESHOLD PULSEWIDTH: 0.5 ms
MDC IDC SET LEADCHNL RA PACING AMPLITUDE: 2 V
MDC IDC SET LEADCHNL RV PACING AMPLITUDE: 2.4 V
MDC IDC SET LEADCHNL RV PACING PULSEWIDTH: 0.5 ms
MDC IDC STAT BRADY RA PERCENT PACED: 1 %
Pulse Gen Serial Number: 588589

## 2015-09-09 NOTE — Progress Notes (Signed)
Pacemaker check in clinic. Normal device function. Thresholds, sensing, impedances consistent with previous measurements. Device programmed to maximize longevity.  (35) mode switches---max dur. 1hr 4 mins, Max A 380---AF//no A/C due to advanced age per ov note. (4) high ventricular rates noted---no EGMs. Device programmed at appropriate safety margins. Histogram distribution appropriate for patient activity level. Device programmed to optimize intrinsic conduction. Estimated longevity >5 years. Patient to follow up with JA in 02-2016.

## 2015-09-12 ENCOUNTER — Encounter: Payer: Self-pay | Admitting: Adult Health

## 2015-09-12 ENCOUNTER — Non-Acute Institutional Stay: Payer: Medicare Other | Admitting: Adult Health

## 2015-09-12 DIAGNOSIS — I4891 Unspecified atrial fibrillation: Secondary | ICD-10-CM

## 2015-09-12 DIAGNOSIS — G309 Alzheimer's disease, unspecified: Principal | ICD-10-CM

## 2015-09-12 DIAGNOSIS — L853 Xerosis cutis: Secondary | ICD-10-CM

## 2015-09-12 DIAGNOSIS — E785 Hyperlipidemia, unspecified: Secondary | ICD-10-CM

## 2015-09-12 DIAGNOSIS — M48061 Spinal stenosis, lumbar region without neurogenic claudication: Secondary | ICD-10-CM

## 2015-09-12 DIAGNOSIS — G308 Other Alzheimer's disease: Secondary | ICD-10-CM

## 2015-09-12 DIAGNOSIS — F0281 Dementia in other diseases classified elsewhere with behavioral disturbance: Secondary | ICD-10-CM

## 2015-09-12 DIAGNOSIS — M4806 Spinal stenosis, lumbar region: Secondary | ICD-10-CM | POA: Diagnosis not present

## 2015-09-12 DIAGNOSIS — I10 Essential (primary) hypertension: Secondary | ICD-10-CM

## 2015-09-12 HISTORY — DX: Hyperlipidemia, unspecified: E78.5

## 2015-09-12 NOTE — Progress Notes (Addendum)
Patient ID: Sydney Arroyo, female   DOB: May 05, 1931, 81 y.o.   MRN: [id number]    Nursing Home Location:  [place] of Service: ALF [phone])  Chief Complaint  Patient presents with  . Medical Management of Chronic Issues    HPI:  81 y.o. female residing at [facility], memory care unit. I am here to review her chronic medical issues. She has a hx of SSS s/p pacemaker, AD, HTN, spinal stenosis, depression, depression, and breast ca.  She is a poor historian but can answer q's.  She continues to walk throughout the facility with a walker, overall slower pace over time.  She denies pain and is using Neurontin for neuropathic pain to her legs.  She sees EP q 6 months to eval her pace maker. She was noted to be intermittently in afib. She was not started on [facility] due to age and debility.  I have reviewed BP logs, revealing stable values She has some periods of sleepiness during the day and is on depakote 250 mg BID.  This was started for agitation and delusions.  Her last MMSE in June of 2016 was 11/30 with a failed clock. She has lost 10 lbs since June due to decreased intake.  Review of Systems:  Review of Systems  Constitutional: Positive for unexpected weight change. Negative for fever, chills, diaphoresis, activity change and appetite change.  HENT: Negative for postnasal drip, rhinorrhea, sore throat and trouble swallowing.   Respiratory: Negative for cough, shortness of breath and wheezing.   Cardiovascular: Negative for chest pain, palpitations and leg swelling.  Gastrointestinal: Negative for nausea, vomiting, abdominal pain, diarrhea, abdominal distention and rectal pain.  Genitourinary: Negative for dysuria and difficulty urinating.       Increased incontinence  Musculoskeletal: Positive for back pain (h/o) and gait problem (uses walker). Negative for arthralgias.       Uses walker  Skin:       Dry skin  Neurological: Negative for  dizziness, seizures, facial asymmetry and speech difficulty.  Psychiatric/Behavioral: Positive for confusion and dysphoric mood. Negative for hallucinations, behavioral problems, sleep disturbance and agitation. The patient is not nervous/anxious.        Sleepy    Medications: Patient's Medications  New Prescriptions   No medications on file  Previous Medications   ACETAMINOPHEN (TYLENOL) 500 MG TABLET    Take 500 mg by mouth every 8 (eight) hours as needed.   AMLODIPINE (NORVASC) 5 MG TABLET    Take 5 mg by mouth daily.   AMOXICILLIN (AMOXIL) 500 MG TABLET    Take 4 tablets by mouth one hour prior to dental   BISACODYL (DULCOLAX) 10 MG SUPPOSITORY    Place 10 mg rectally as needed for moderate constipation.   CHOLECALCIFEROL (VITAMIN D3) 2000 UNITS TABS    Take 1 tablet by mouth daily.   COLESEVELAM (WELCHOL) 625 MG TABLET    Take 625 mg by mouth daily. For cholesterol   DIVALPROEX (DEPAKOTE SPRINKLE) 125 MG CAPSULE    Take 125 mg by mouth 2 (two) times daily. 125 mg in the am and 250 mg pm   DONEPEZIL (ARICEPT) 10 MG TABLET    Take 10 mg by mouth daily.    DULOXETINE (CYMBALTA) 60 MG CAPSULE    Take 1 capsule (60 mg total) by mouth daily.   GABAPENTIN (NEURONTIN) 100 MG CAPSULE    Take 1 capsule (100 mg total) by mouth at bedtime.   LORAZEPAM (ATIVAN)  0.5 MG TABLET    Take 0.25 mg by mouth every 6 (six) hours as needed for anxiety.   LOSARTAN-HYDROCHLOROTHIAZIDE (HYZAAR) 100-25 MG PER TABLET    Take 1 tablet by mouth daily.   MEMANTINE HCL ER (NAMENDA XR) 28 MG CP24    Take 28 mg by mouth every morning. For memory   METOPROLOL SUCCINATE (TOPROL-XL) 25 MG 24 HR TABLET    Take 25 mg by mouth daily.   MONTELUKAST (SINGULAIR) 10 MG TABLET    Take 10 mg by mouth at bedtime.   QUETIAPINE (SEROQUEL) 50 MG TABLET    Take 50 mg by mouth 2 (two) times daily.  Modified Medications   No medications on file  Discontinued Medications   No medications on file     Physical Exam:  Filed Vitals:     09/12/15 1111  Weight: 175 lb (79.379 kg)   Wt Readings from Last 3 Encounters:  09/12/15 175 lb (79.379 kg)  06/07/15 183 lb 3.2 oz (83.099 kg)  05/24/15 185 lb (83.915 kg)     Physical Exam  Constitutional: She is well-developed, well-nourished, and in no distress. No distress.  HENT:  Head: Normocephalic and atraumatic.  Neck: Normal range of motion. Neck supple.  Cardiovascular: Normal rate and regular rhythm.   No murmur heard. Pulmonary/Chest: Effort normal and breath sounds normal. No respiratory distress.  Abdominal: Soft. Bowel sounds are normal. She exhibits no distension and no mass. There is no tenderness.  rotund  Musculoskeletal: She exhibits no edema or tenderness.  No tenderness to the spine, negative straight leg raise  Neurological: She is alert. No cranial nerve deficit. Coordination normal.  Oriented to self and able to f/c and answer q's.  Less verbal and less participatory in conversation  Skin: Skin is warm and dry. She is not diaphoretic. No erythema.  Extra dry skin to BLE  Psychiatric:  sleepy    Labs reviewed/Significant Diagnostic Results:  Basic Metabolic Panel:  Recent Labs  01/05/15 03/14/15 07/05/15  NA 138 134* 137  K 3.7 3.9 4.1  BUN 14 16 17   CREATININE 0.7 0.8 0.5   Liver Function Tests:  Recent Labs  01/05/15 03/14/15 06/16/15  AST 17 19 22   ALT 13 17 19   ALKPHOS 62 65 56   No results for input(s): LIPASE, AMYLASE in the last 8760 hours. No results for input(s): AMMONIA in the last 8760 hours. CBC:  Recent Labs  01/05/15 03/14/15 06/16/15  WBC 7.7 9.4 7.1  HGB 13.5 14.2 14.3  HCT 39 42 42  PLT 202 202 198   Wt Readings from Last 3 Encounters:  09/12/15 175 lb (79.379 kg)  06/07/15 183 lb 3.2 oz (83.099 kg)  05/24/15 185 lb (83.915 kg)   Lab Results  Component Value Date   TSH 1.91 01/05/2015   07/05/15: depakote level 42  Assessment/Plan  1. Alzheimer's dementia with behavioral disturbance -decreased  depakote to 125 in the am and 250 in the pm due to sleepiness -continue other meds the same -overall decline cognitively in review of MMSE scores and losing weight -DNR status   2. Spinal stenosis of lumbar region -denies pain, continue neurontin  3. Essential hypertension -controlled BMP ok  4. Atrial fibrillation, new onset ([place]) -noted in cards apt -continue toprol to control rate, no AC due to fall risk and debility  5. HLD (hyperlipidemia) -currently on welchol -lipid panel due Feb 2017, consider d/c at next visit  6. Xerosis -cetaphil cream BID  [name], ANP [facility] [phone]

## 2015-10-25 ENCOUNTER — Non-Acute Institutional Stay: Payer: Medicare Other | Admitting: Internal Medicine

## 2015-10-25 DIAGNOSIS — E7800 Pure hypercholesterolemia, unspecified: Secondary | ICD-10-CM | POA: Diagnosis not present

## 2015-10-25 DIAGNOSIS — M48061 Spinal stenosis, lumbar region without neurogenic claudication: Secondary | ICD-10-CM

## 2015-10-25 DIAGNOSIS — I10 Essential (primary) hypertension: Secondary | ICD-10-CM

## 2015-10-25 DIAGNOSIS — F329 Major depressive disorder, single episode, unspecified: Secondary | ICD-10-CM

## 2015-10-25 DIAGNOSIS — F0281 Dementia in other diseases classified elsewhere with behavioral disturbance: Secondary | ICD-10-CM

## 2015-10-25 DIAGNOSIS — G309 Alzheimer's disease, unspecified: Secondary | ICD-10-CM

## 2015-10-25 DIAGNOSIS — R451 Restlessness and agitation: Secondary | ICD-10-CM

## 2015-10-25 DIAGNOSIS — F32A Depression, unspecified: Secondary | ICD-10-CM

## 2015-10-25 DIAGNOSIS — M4806 Spinal stenosis, lumbar region: Secondary | ICD-10-CM | POA: Diagnosis not present

## 2015-10-25 DIAGNOSIS — G308 Other Alzheimer's disease: Secondary | ICD-10-CM | POA: Diagnosis not present
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
## 2015-11-08 NOTE — Progress Notes (Signed)
This encounter was created in error - please disregard.

## 2015-11-23 DIAGNOSIS — R410 Disorientation, unspecified: Secondary | ICD-10-CM | POA: Diagnosis not present

## 2015-11-23 DIAGNOSIS — R4182 Altered mental status, unspecified: Secondary | ICD-10-CM | POA: Diagnosis not present

## 2015-11-24 ENCOUNTER — Non-Acute Institutional Stay: Payer: Medicare Other | Admitting: Adult Health

## 2015-11-24 DIAGNOSIS — F02818 Dementia in other diseases classified elsewhere, unspecified severity, with other behavioral disturbance: Secondary | ICD-10-CM

## 2015-11-24 DIAGNOSIS — F0281 Dementia in other diseases classified elsewhere with behavioral disturbance: Secondary | ICD-10-CM

## 2015-11-24 DIAGNOSIS — R2681 Unsteadiness on feet: Secondary | ICD-10-CM

## 2015-11-24 DIAGNOSIS — G308 Other Alzheimer's disease: Secondary | ICD-10-CM

## 2015-11-24 DIAGNOSIS — R5383 Other fatigue: Secondary | ICD-10-CM | POA: Diagnosis not present

## 2015-11-24 DIAGNOSIS — G309 Alzheimer's disease, unspecified: Secondary | ICD-10-CM

## 2015-11-25 DIAGNOSIS — R5382 Chronic fatigue, unspecified: Secondary | ICD-10-CM | POA: Diagnosis not present

## 2015-11-25 DIAGNOSIS — R410 Disorientation, unspecified: Secondary | ICD-10-CM | POA: Diagnosis not present

## 2015-11-25 DIAGNOSIS — R4182 Altered mental status, unspecified: Secondary | ICD-10-CM | POA: Diagnosis not present

## 2015-11-25 LAB — CBC AND DIFFERENTIAL
HCT: 44 % (ref 36–46)
HEMOGLOBIN: 14.9 g/dL (ref 12.0–16.0)
Platelets: 143 10*3/uL — AB (ref 150–399)
WBC: 8.4 10*3/mL

## 2015-11-25 LAB — HEPATIC FUNCTION PANEL
ALT: 25 U/L (ref 7–35)
AST: 29 U/L (ref 13–35)
Alkaline Phosphatase: 55 U/L (ref 25–125)
BILIRUBIN, TOTAL: 0.6 mg/dL

## 2015-11-25 LAB — BASIC METABOLIC PANEL
BUN: 27 mg/dL — AB (ref 4–21)
Creatinine: 0.9 mg/dL (ref 0.5–1.1)
GLUCOSE: 102 mg/dL
POTASSIUM: 3.5 mmol/L (ref 3.4–5.3)
Sodium: 140 mmol/L (ref 137–147)

## 2015-11-26 ENCOUNTER — Encounter: Payer: Self-pay | Admitting: Adult Health

## 2015-11-26 NOTE — Progress Notes (Signed)
Patient ID: MADDYN SHIRD, female   DOB: November 17, 1931, 81 y.o.   MRN: [id number]    Nursing Home Location:  [place] [facility] Service: [phone])  Chief Complaint  Patient presents with  . Acute Visit    sleepy, weak, unsteady gait    HPI:  81 y.o. female residing at [facility], memory care unit.She has a hx of SSS s/p pacemaker, AD, HTN, spinal stenosis,depression, and breast ca.   I was asked to see her for periods of sleepiness, unsteady gait, and weakness. She typically is ambulatory with a walker and is now requiring a wheelchair. She has a hx of agitation due to dementia necessitating depakote and seroquel.  Staff denies that she has had a cough, fever, urinary symptoms, etc. Urine dip was negative.  VS WNL.  Review of Systems:  Review of Systems  Constitutional: Positive for activity change and fatigue. Negative for fever, chills, diaphoresis, appetite change and unexpected weight change.  HENT: Negative for congestion, sore throat and trouble swallowing.   Respiratory: Negative for cough, shortness of breath and wheezing.   Cardiovascular: Negative for chest pain, palpitations and leg swelling.  Gastrointestinal: Negative for nausea, vomiting, abdominal pain, diarrhea, abdominal distention and rectal pain.  Genitourinary: Negative for dysuria and difficulty urinating.  Musculoskeletal: Positive for back pain (h/o) and gait problem (uses wheelchair ). Negative for arthralgias.  Neurological: Positive for weakness. Negative for dizziness, seizures, facial asymmetry and speech difficulty.  Psychiatric/Behavioral: Positive for confusion and dysphoric mood. Negative for hallucinations, behavioral problems, sleep disturbance and agitation. The patient is not nervous/anxious.        Sleepy    Medications: Patient's Medications  New Prescriptions   No medications on file  Previous Medications   ACETAMINOPHEN (TYLENOL) 500 MG TABLET     Take 500 mg by mouth every 8 (eight) hours as needed.   AMLODIPINE (NORVASC) 5 MG TABLET    Take 5 mg by mouth daily.   AMOXICILLIN (AMOXIL) 500 MG TABLET    Take 4 tablets by mouth one hour prior to dental   BISACODYL (DULCOLAX) 10 MG SUPPOSITORY    Place 10 mg rectally as needed for moderate constipation.   CHOLECALCIFEROL (VITAMIN D3) 2000 UNITS TABS    Take 1 tablet by mouth daily.   COLESEVELAM (WELCHOL) 625 MG TABLET    Take 625 mg by mouth daily. For cholesterol   DIVALPROEX (DEPAKOTE SPRINKLE) 125 MG CAPSULE    Take 125 mg by mouth 2 (two) times daily.    DONEPEZIL (ARICEPT) 10 MG TABLET    Take 10 mg by mouth daily.    DULOXETINE (CYMBALTA) 60 MG CAPSULE    Take 1 capsule (60 mg total) by mouth daily.   GABAPENTIN (NEURONTIN) 100 MG CAPSULE    Take 1 capsule (100 mg total) by mouth at bedtime.   LORAZEPAM (ATIVAN) 0.5 MG TABLET    Take 0.25 mg by mouth every 6 (six) hours as needed for anxiety.   LOSARTAN-HYDROCHLOROTHIAZIDE (HYZAAR) 100-25 MG PER TABLET    Take 1 tablet by mouth daily.   MEMANTINE HCL ER (NAMENDA XR) 28 MG CP24    Take 28 mg by mouth every morning. For memory   METOPROLOL SUCCINATE (TOPROL-XL) 25 MG 24 HR TABLET    Take 25 mg by mouth daily.   MONTELUKAST (SINGULAIR) 10 MG TABLET    Take 10 mg by mouth at bedtime.   QUETIAPINE (SEROQUEL) 50 MG TABLET    Take  50 mg by mouth 2 (two) times daily.  Modified Medications   No medications on file  Discontinued Medications   No medications on file     Physical Exam:  Filed Vitals:   11/26/15 1054  BP: 132/70  Pulse: 74  Temp: 97.5 F (36.4 C)  Resp: 20  SpO2: 94%   Wt Readings from Last 3 Encounters:  09/12/15 175 lb (79.379 kg)  06/07/15 183 lb 3.2 oz (83.099 kg)  05/24/15 185 lb (83.915 kg)     Physical Exam  Constitutional: She is well-developed, well-nourished, and in no distress. No distress.  HENT:  Head: Normocephalic and atraumatic.  Eyes: Conjunctivae and EOM are normal. Pupils are equal,  round, and reactive to light. Right eye exhibits no discharge. Left eye exhibits no discharge.  Neck: Normal range of motion. Neck supple. No JVD present.  Cardiovascular: Normal rate and regular rhythm.   No murmur heard. Pulmonary/Chest: Effort normal and breath sounds normal. No respiratory distress.  Abdominal: Soft. Bowel sounds are normal. She exhibits no distension and no mass. There is no tenderness.  rotund  Musculoskeletal: Normal range of motion. She exhibits no edema or tenderness.  Neurological: She is alert. No cranial nerve deficit.  Oriented to self, able to f/c when awake but drifts to sleep during conversation  Skin: Skin is warm and dry. She is not diaphoretic.  Psychiatric:  sleepy    Labs reviewed/Significant Diagnostic Results:  Basic Metabolic Panel:  Recent Labs  01/05/15 03/14/15 07/05/15  NA 138 134* 137  K 3.7 3.9 4.1  BUN 14 16 17   CREATININE 0.7 0.8 0.5   Liver Function Tests:  Recent Labs  01/05/15 03/14/15 06/16/15  AST 17 19 22   ALT 13 17 19   ALKPHOS 62 65 56   No results for input(s): LIPASE, AMYLASE in the last 8760 hours. No results for input(s): AMMONIA in the last 8760 hours. CBC:  Recent Labs  01/05/15 03/14/15 06/16/15  WBC 7.7 9.4 7.1  HGB 13.5 14.2 14.3  HCT 39 42 42  PLT 202 202 198   Wt Readings from Last 3 Encounters:  09/12/15 175 lb (79.379 kg)  06/07/15 183 lb 3.2 oz (83.099 kg)  05/24/15 185 lb (83.915 kg)   Lab Results  Component Value Date   TSH 1.91 01/05/2015   07/05/15: depakote level 42  Assessment/Plan  1. Lethargy -no focal neurologic deficit -?oversedation from depakote -decrease to Depakote 125 mg BID -check depakote level, CBC, CMP in am  2. Unsteady gait -continue with wheelchair use until more alert to attempt ambulation -may need PT but will wait to see if she is more alert to participate -most likely due to progressive dementia and sedative effect  3. Alzheimer's dementia with behavioral  disturbance -progressive decline in mentation and mobility -continue aricept and namenda for added benefit    [name], [place] [phone]

## 2015-12-01 ENCOUNTER — Non-Acute Institutional Stay: Payer: Medicare Other | Admitting: Adult Health

## 2015-12-01 ENCOUNTER — Observation Stay (HOSPITAL_COMMUNITY): Payer: Medicare Other

## 2015-12-01 ENCOUNTER — Encounter (HOSPITAL_COMMUNITY): Payer: Self-pay

## 2015-12-01 ENCOUNTER — Emergency Department (HOSPITAL_COMMUNITY): Payer: Medicare Other

## 2015-12-01 ENCOUNTER — Encounter: Payer: Self-pay | Admitting: Adult Health

## 2015-12-01 ENCOUNTER — Inpatient Hospital Stay (HOSPITAL_COMMUNITY)
Admission: EM | Admit: 2015-12-01 | Discharge: 2015-12-06 | DRG: 071 | Disposition: A | Discharge status: Skilled Nursing Facility | Payer: Medicare Other | Attending: Internal Medicine | Admitting: Internal Medicine

## 2015-12-01 DIAGNOSIS — R2990 Unspecified symptoms and signs involving the nervous system: Secondary | ICD-10-CM | POA: Diagnosis not present

## 2015-12-01 DIAGNOSIS — R29810 Facial weakness: Secondary | ICD-10-CM | POA: Diagnosis present

## 2015-12-01 DIAGNOSIS — R4781 Slurred speech: Secondary | ICD-10-CM | POA: Diagnosis not present

## 2015-12-01 DIAGNOSIS — E876 Hypokalemia: Secondary | ICD-10-CM | POA: Diagnosis not present

## 2015-12-01 DIAGNOSIS — Z87891 Personal history of nicotine dependence: Secondary | ICD-10-CM

## 2015-12-01 DIAGNOSIS — G309 Alzheimer's disease, unspecified: Secondary | ICD-10-CM | POA: Diagnosis present

## 2015-12-01 DIAGNOSIS — I495 Sick sinus syndrome: Secondary | ICD-10-CM | POA: Diagnosis not present

## 2015-12-01 DIAGNOSIS — N179 Acute kidney failure, unspecified: Secondary | ICD-10-CM | POA: Diagnosis not present

## 2015-12-01 DIAGNOSIS — F02818 Dementia in other diseases classified elsewhere, unspecified severity, with other behavioral disturbance: Secondary | ICD-10-CM | POA: Diagnosis present

## 2015-12-01 DIAGNOSIS — Z853 Personal history of malignant neoplasm of breast: Secondary | ICD-10-CM | POA: Diagnosis not present

## 2015-12-01 DIAGNOSIS — Z9049 Acquired absence of other specified parts of digestive tract: Secondary | ICD-10-CM | POA: Diagnosis not present

## 2015-12-01 DIAGNOSIS — F0281 Dementia in other diseases classified elsewhere with behavioral disturbance: Secondary | ICD-10-CM

## 2015-12-01 DIAGNOSIS — I6789 Other cerebrovascular disease: Secondary | ICD-10-CM | POA: Diagnosis not present

## 2015-12-01 DIAGNOSIS — I4891 Unspecified atrial fibrillation: Secondary | ICD-10-CM | POA: Diagnosis not present

## 2015-12-01 DIAGNOSIS — G20 Parkinson's disease: Secondary | ICD-10-CM | POA: Diagnosis not present

## 2015-12-01 DIAGNOSIS — G308 Other Alzheimer's disease: Secondary | ICD-10-CM | POA: Diagnosis not present

## 2015-12-01 DIAGNOSIS — I10 Essential (primary) hypertension: Secondary | ICD-10-CM | POA: Diagnosis not present

## 2015-12-01 DIAGNOSIS — E860 Dehydration: Secondary | ICD-10-CM | POA: Diagnosis not present

## 2015-12-01 DIAGNOSIS — Z96649 Presence of unspecified artificial hip joint: Secondary | ICD-10-CM | POA: Diagnosis present

## 2015-12-01 DIAGNOSIS — R531 Weakness: Secondary | ICD-10-CM | POA: Diagnosis present

## 2015-12-01 DIAGNOSIS — N390 Urinary tract infection, site not specified: Secondary | ICD-10-CM | POA: Diagnosis present

## 2015-12-01 DIAGNOSIS — G9340 Encephalopathy, unspecified: Principal | ICD-10-CM

## 2015-12-01 DIAGNOSIS — G4733 Obstructive sleep apnea (adult) (pediatric): Secondary | ICD-10-CM | POA: Diagnosis present

## 2015-12-01 DIAGNOSIS — R4182 Altered mental status, unspecified: Secondary | ICD-10-CM

## 2015-12-01 DIAGNOSIS — I6523 Occlusion and stenosis of bilateral carotid arteries: Secondary | ICD-10-CM | POA: Diagnosis not present

## 2015-12-01 DIAGNOSIS — I739 Peripheral vascular disease, unspecified: Secondary | ICD-10-CM | POA: Diagnosis present

## 2015-12-01 DIAGNOSIS — Z90710 Acquired absence of both cervix and uterus: Secondary | ICD-10-CM

## 2015-12-01 DIAGNOSIS — R400 Somnolence: Secondary | ICD-10-CM | POA: Diagnosis not present

## 2015-12-01 DIAGNOSIS — I6602 Occlusion and stenosis of left middle cerebral artery: Secondary | ICD-10-CM | POA: Diagnosis not present

## 2015-12-01 DIAGNOSIS — R5383 Other fatigue: Secondary | ICD-10-CM | POA: Diagnosis not present

## 2015-12-01 DIAGNOSIS — I679 Cerebrovascular disease, unspecified: Secondary | ICD-10-CM | POA: Diagnosis present

## 2015-12-01 DIAGNOSIS — E7800 Pure hypercholesterolemia, unspecified: Secondary | ICD-10-CM | POA: Diagnosis present

## 2015-12-01 DIAGNOSIS — G2581 Restless legs syndrome: Secondary | ICD-10-CM | POA: Diagnosis present

## 2015-12-01 DIAGNOSIS — Z66 Do not resuscitate: Secondary | ICD-10-CM | POA: Diagnosis present

## 2015-12-01 DIAGNOSIS — Z950 Presence of cardiac pacemaker: Secondary | ICD-10-CM

## 2015-12-01 LAB — I-STAT CHEM 8, ED
BUN: 27 mg/dL — AB (ref 6–20)
CALCIUM ION: 1.09 mmol/L — AB (ref 1.13–1.30)
CHLORIDE: 103 mmol/L (ref 101–111)
Creatinine, Ser: 0.6 mg/dL (ref 0.44–1.00)
GLUCOSE: 99 mg/dL (ref 65–99)
HCT: 49 % — ABNORMAL HIGH (ref 36.0–46.0)
Hemoglobin: 16.7 g/dL — ABNORMAL HIGH (ref 12.0–15.0)
POTASSIUM: 5 mmol/L (ref 3.5–5.1)
Sodium: 142 mmol/L (ref 135–145)
TCO2: 31 mmol/L (ref 0–100)

## 2015-12-01 LAB — COMPREHENSIVE METABOLIC PANEL
ALK PHOS: 62 U/L (ref 38–126)
ALT: 27 U/L (ref 14–54)
AST: 55 U/L — ABNORMAL HIGH (ref 15–41)
Albumin: 3.2 g/dL — ABNORMAL LOW (ref 3.5–5.0)
Anion gap: 11 (ref 5–15)
BILIRUBIN TOTAL: 1.1 mg/dL (ref 0.3–1.2)
BUN: 17 mg/dL (ref 6–20)
CALCIUM: 9.7 mg/dL (ref 8.9–10.3)
CO2: 30 mmol/L (ref 22–32)
CREATININE: 0.71 mg/dL (ref 0.44–1.00)
Chloride: 101 mmol/L (ref 101–111)
Glucose, Bld: 105 mg/dL — ABNORMAL HIGH (ref 65–99)
Potassium: 4.6 mmol/L (ref 3.5–5.1)
Sodium: 142 mmol/L (ref 135–145)
Total Protein: 6.4 g/dL — ABNORMAL LOW (ref 6.5–8.1)

## 2015-12-01 LAB — CBC
HEMATOCRIT: 48.8 % — AB (ref 36.0–46.0)
Hemoglobin: 16.5 g/dL — ABNORMAL HIGH (ref 12.0–15.0)
MCH: 34.3 pg — ABNORMAL HIGH (ref 26.0–34.0)
MCHC: 33.8 g/dL (ref 30.0–36.0)
MCV: 101.5 fL — AB (ref 78.0–100.0)
Platelets: 128 10*3/uL — ABNORMAL LOW (ref 150–400)
RBC: 4.81 MIL/uL (ref 3.87–5.11)
RDW: 13.2 % (ref 11.5–15.5)
WBC: 7.3 10*3/uL (ref 4.0–10.5)

## 2015-12-01 LAB — APTT: aPTT: 30 seconds (ref 24–37)

## 2015-12-01 LAB — URINALYSIS, ROUTINE W REFLEX MICROSCOPIC
BILIRUBIN URINE: NEGATIVE
Glucose, UA: NEGATIVE mg/dL
HGB URINE DIPSTICK: NEGATIVE
Ketones, ur: 15 mg/dL — AB
NITRITE: NEGATIVE
PH: 6 (ref 5.0–8.0)
Protein, ur: NEGATIVE mg/dL
SPECIFIC GRAVITY, URINE: 1.023 (ref 1.005–1.030)

## 2015-12-01 LAB — DIFFERENTIAL
Basophils Absolute: 0 10*3/uL (ref 0.0–0.1)
Basophils Relative: 0 %
Eosinophils Absolute: 0.1 10*3/uL (ref 0.0–0.7)
Eosinophils Relative: 2 %
LYMPHS ABS: 2.6 10*3/uL (ref 0.7–4.0)
LYMPHS PCT: 35 %
MONO ABS: 0.9 10*3/uL (ref 0.1–1.0)
MONOS PCT: 12 %
NEUTROS ABS: 3.7 10*3/uL (ref 1.7–7.7)
Neutrophils Relative %: 51 %

## 2015-12-01 LAB — I-STAT TROPONIN, ED: TROPONIN I, POC: 0.01 ng/mL (ref 0.00–0.08)

## 2015-12-01 LAB — URINE MICROSCOPIC-ADD ON: RBC / HPF: NONE SEEN RBC/hpf (ref 0–5)

## 2015-12-01 LAB — CBG MONITORING, ED: Glucose-Capillary: 94 mg/dL (ref 65–99)

## 2015-12-01 LAB — PROTIME-INR
INR: 0.96 (ref 0.00–1.49)
Prothrombin Time: 13 seconds (ref 11.6–15.2)

## 2015-12-01 IMAGING — CT CT CEREBRAL PERFUSION W/CM
1 of 5 series · 1 of 30 positions shown · IV contrast (Iohexol (Omnipaque 350))
Comparison: Prior head CT from earlier the same day.

CLINICAL DATA: Initial evaluation for possible right facial droop
and right-sided weakness.

EXAM:
CT ANGIOGRAPHY HEAD AND NECK
TECHNIQUE: Multidetector CT imaging of the head and neck was performed using
the standard protocol during bolus administration of intravenous
contrast. Multiplanar CT image reconstructions and MIPs were
obtained to evaluate the vascular anatomy. Carotid stenosis
measurements (when applicable) are obtained utilizing NASCET
criteria, using the distal internal carotid diameter as the
denominator.
CONTRAST:  100mL OMNIPAQUE IOHEXOL 350 MG/ML SOLN

[Series 2011: ttp batch · 5.0mm · 0.43mm/px · 1 of 10 slices shown]
[im 6/10]
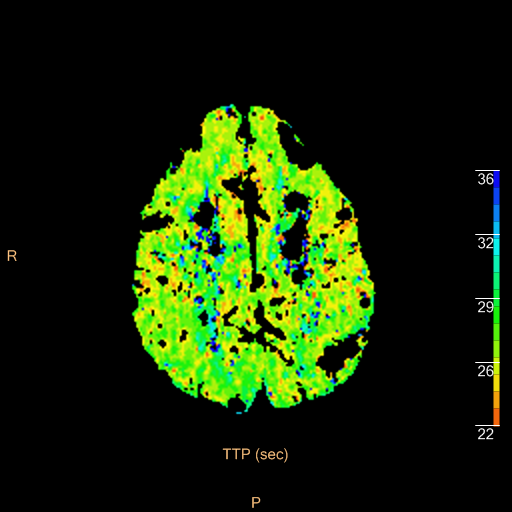

[1 of 30 positions shown; findings below may reference images not displayed]

FINDINGS: CTA NECK

Aortic arch: Visualized aortic arch is of normal caliber with normal
3 vessel morphology. Scattered atheromatous plaque present within
the arch itself without stenosis. Scattered plaque present within
the proximal subclavian arteries without high-grade flow-limiting
stenosis.

Right carotid system: Origin of right common carotid artery widely
patent. The proximal right common carotid artery is tortuous
proximally. Right common carotid artery widely patent to its distal
aspect. Atheromatous plaque within the distal right common carotid
artery prior to the bifurcation results in moderate stenosis
(approximately 40-50%. Distally, there is extensive atheromatous
plaque about the right carotid bifurcation/proximal right ICA. There
is short-segment atheromatous stenosis at the origin of the right
ICA avid least 80-90% by NASCET criteria (series 502, image 215).
Distally, right ICA widely patent to the skullbase.

Left carotid system: Left common carotid artery patent from its
origin to the bifurcation. Scattered calcified plaque about the left
carotid bifurcation/proximal left ICA with associated short segment
stenosis of approximately 50% by NASCET criteria. Distally, left ICA
is tortuous but widely patent to the skullbase.

Vertebral arteries:Both vertebral arteries arise from the subclavian
arteries. Origin of the vertebral arteries widely patent. Pre
foraminal V1 segments are tortuous. Vertebral arteries patent along
their entire course without focal stenosis, dissection, or vascular
occlusion.

Skeleton: Reversal of the normal cervical lordosis. Trace
anterolisthesis of C2 on C3. Advanced degenerative spondylolysis at
C4-5 and C6-7. No acute osseous abnormality.

Other neck: Visualized lungs demonstrate no acute process. Partially
visualized superior mediastinum normal. Subcentimeter hypodense
nodule within the right lobe of thyroid noted, of doubtful clinical
significance. No adenopathy. No acute soft tissue abnormality within
the neck.

CTA HEAD

Anterior circulation: Petrous segments widely patent. Smooth
atheromatous plaque throughout the carotid siphons with resultant
mild diffuse narrowing. Plaque extends into the supraclinoid
segments without significant stenosis. Left A1 segment slightly
hypoplastic but widely patent. Right A1 segment patent. Anterior
communicating artery normal. Anterior cerebral arteries opacified to
their distal aspects.

M1 segments widely patent without stenosis or occlusion. MCA
bifurcations normal. No proximal M2 branch occlusion. Short
short-segment moderate stenosis within a proximal left M2 branch,
seen best on MIP reconstructions (series 507, image 20). MCA
branches well opacified distally and symmetric.

Posterior circulation: Vertebral arteries patent to the
vertebrobasilar junction. Posterior inferior cerebral arteries
patent. Basilar artery tortuous but widely patent. Superior
cerebellar arteries well opacified bilaterally. Both posterior
cerebral arteries arise from the basilar artery. PCAs resting the
basilar artery and are well opacified to their distal aspects. Mild
distal small vessel changes within the distal PCAs.

Venous sinuses: Patent without venous sinus thrombosis.

Anatomic variants: No anatomic variant. 3 x 3 x 4 mm focal
outpouching arising from the supraclinoid right ICA consistent with
a small posterior communicating artery aneurysm (series 504, image
94).

Delayed phase: No abnormal enhancement on delayed sequence.

CT PERFUSION: No perfusion mismatch to suggest acute ischemia
identified. Subtle elevated mean transit time and time to peak
within the anterior left MCA distribution, like related to the
previously identified proximal left M2 branch stenosis. No other
perfusion abnormality.
IMPRESSION: CTA NECK IMPRESSION:

1. Atheromatous plaque at the right carotid bifurcation/proximal
right ICA with associated short segment stenosis of 80-90% by NASCET
criteria at the origin of the right ICA.
2. Atheromatous stenosis of approximately 50% at the origin of the
left ICA.
3. Widely patent vertebral arteries.

CTA HEAD IMPRESSION:

1. No large or proximal arterial branch occlusion.
2. Short-segment moderate proximal left M2 stenosis as above.
3. 4 x 3 x 3 mm right P com aneurysm.
CT PERFUSION:

1. No perfusion mismatch to suggest acute ischemia.
2. Subtly increased mean transit time and time to peak within the
left MCA distribution, likely related to the proximal left M2
stenosis.

Critical Value/emergent results were called by telephone at the time
of interpretation on 12/01/2015 at [DATE] to Dr. Qookie , who
verbally acknowledged these results.

## 2015-12-01 MED ORDER — COLESEVELAM HCL 625 MG PO TABS
625.0000 mg | ORAL_TABLET | Freq: Every day | ORAL | Status: DC
  Administered 2015-12-02 – 2015-12-06 (×5): 625 mg via ORAL
  Filled 2015-12-01 (×5): qty 1

## 2015-12-01 MED ORDER — DONEPEZIL HCL 10 MG PO TABS
10.0000 mg | ORAL_TABLET | Freq: Every day | ORAL | Status: DC
  Administered 2015-12-02 – 2015-12-03 (×3): 10 mg via ORAL
  Filled 2015-12-01 (×3): qty 1

## 2015-12-01 MED ORDER — MONTELUKAST SODIUM 10 MG PO TABS
10.0000 mg | ORAL_TABLET | Freq: Every day | ORAL | Status: DC
  Administered 2015-12-02 – 2015-12-05 (×5): 10 mg via ORAL
  Filled 2015-12-01 (×5): qty 1

## 2015-12-01 MED ORDER — METOPROLOL SUCCINATE ER 25 MG PO TB24
25.0000 mg | ORAL_TABLET | Freq: Every day | ORAL | Status: DC
  Administered 2015-12-02 – 2015-12-06 (×6): 25 mg via ORAL
  Filled 2015-12-01 (×6): qty 1

## 2015-12-01 MED ORDER — ACETAMINOPHEN 325 MG PO TABS: 650.0000 mg | ORAL_TABLET | ORAL | Status: DC | PRN

## 2015-12-01 MED ORDER — ENOXAPARIN SODIUM 40 MG/0.4ML ~~LOC~~ SOLN
40.0000 mg | SUBCUTANEOUS | Status: DC
  Administered 2015-12-02 – 2015-12-04 (×3): 40 mg via SUBCUTANEOUS
  Filled 2015-12-01 (×3): qty 0.4

## 2015-12-01 MED ORDER — ACETAMINOPHEN 650 MG RE SUPP: 650.0000 mg | RECTAL | Status: DC | PRN

## 2015-12-01 MED ORDER — ACETAMINOPHEN 500 MG PO TABS: 500.0000 mg | ORAL_TABLET | Freq: Three times a day (TID) | ORAL | Status: DC | PRN

## 2015-12-01 MED ORDER — HYDROCHLOROTHIAZIDE 25 MG PO TABS
25.0000 mg | ORAL_TABLET | Freq: Every day | ORAL | Status: DC
  Administered 2015-12-02 – 2015-12-04 (×3): 25 mg via ORAL
  Filled 2015-12-01 (×3): qty 1

## 2015-12-01 MED ORDER — SENNOSIDES-DOCUSATE SODIUM 8.6-50 MG PO TABS: 1.0000 | ORAL_TABLET | Freq: Every evening | ORAL | Status: DC | PRN

## 2015-12-01 MED ORDER — VITAMIN D 1000 UNITS PO TABS
1000.0000 [IU] | ORAL_TABLET | Freq: Every day | ORAL | Status: DC
  Administered 2015-12-02 – 2015-12-06 (×5): 1000 [IU] via ORAL
  Filled 2015-12-01 (×5): qty 1

## 2015-12-01 MED ORDER — LOSARTAN POTASSIUM 50 MG PO TABS
100.0000 mg | ORAL_TABLET | Freq: Every day | ORAL | Status: DC
  Administered 2015-12-02 – 2015-12-04 (×3): 100 mg via ORAL
  Filled 2015-12-01 (×3): qty 2

## 2015-12-01 MED ORDER — LORAZEPAM 0.5 MG PO TABS
0.2500 mg | ORAL_TABLET | Freq: Four times a day (QID) | ORAL | Status: DC | PRN
  Administered 2015-12-04: 0.25 mg via ORAL
  Filled 2015-12-01: qty 1

## 2015-12-01 MED ORDER — MEMANTINE HCL ER 28 MG PO CP24
28.0000 mg | ORAL_CAPSULE | Freq: Every day | ORAL | Status: DC
  Administered 2015-12-02 – 2015-12-06 (×5): 28 mg via ORAL
  Filled 2015-12-01 (×5): qty 1

## 2015-12-01 MED ORDER — LOSARTAN POTASSIUM-HCTZ 100-25 MG PO TABS: 1.0000 | ORAL_TABLET | Freq: Every day | ORAL | Status: DC

## 2015-12-01 MED ORDER — IOHEXOL 350 MG/ML SOLN
100.0000 mL | Freq: Once | INTRAVENOUS | Status: AC | PRN
  Administered 2015-12-01: 100 mL via INTRAVENOUS

## 2015-12-01 MED ORDER — AMLODIPINE BESYLATE 5 MG PO TABS
5.0000 mg | ORAL_TABLET | Freq: Every day | ORAL | Status: DC
  Administered 2015-12-02 – 2015-12-06 (×6): 5 mg via ORAL
  Filled 2015-12-01 (×6): qty 1

## 2015-12-01 MED ORDER — DULOXETINE HCL 30 MG PO CPEP
30.0000 mg | ORAL_CAPSULE | Freq: Every day | ORAL | Status: DC
  Administered 2015-12-02 – 2015-12-06 (×5): 30 mg via ORAL
  Filled 2015-12-01 (×5): qty 1

## 2015-12-01 NOTE — ED Notes (Signed)
Pt. Coming from nursing facility via [facility] with suspected stroke symptoms. Pt. Facility reported new onset right-sided facial droop with some right-sided weakness. Pt. Hx of alzheimer's. Pt. Facility reports pt. Not at baseline at this time with some altered mentation. Pt. Facility reports episode like this last week. Pt. NP saw her around 3pm and recommended pt. Be observed, but family wanted to send pt. To hospital. Pt. Has no new meds lately. Pt. Very lethargic upon arrival to facility. Pt. AO to person only.
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
## 2015-12-01 NOTE — Code Documentation (Signed)
82 year old female presents to ED from [facility] as code stroke.  Staff at [facility] reports patient was drowsy this AM got better at lunch - after lunch around 1pm she was found with decreased LOC and some intermittent right side weakness.  Was seen by NP = later family asked that she be checked out in hospital.  On arrival she is drowsy - difficult to arouse but will follow simple commands - but easily falls back to sleep.  Some minor right facial droop noted. No drift in arms.  Legs equally weak bilaterally.  No correct answers to questions - she does reside in memory unit - would normally not get these right.  Has loss of fluency of speech - can name few objects.  After CT scan and exam in ED she became more alert - naming objects - reading sentences - but with confusion to person, place and time.  Initial NIHSS 10 now reduced to 5.  Unsure of exact baseline cognition.  Dr. Silverio Decamp present - per CT report he has ordered at [facility] and CT perfusion scan.  Patient has very thin veins - failed attempt at 18 gauge IV.  EDP notified for ultrasound assistance for catheter.  Patient more alert - no weakness in extremities - speech more fluent - recounting times at beach.  Handoff to [employer]  Will follow as needed.

## 2015-12-01 NOTE — Progress Notes (Addendum)
Patient ID: Sydney Arroyo, female   DOB: [birth year], 82 y.o.   MRN: [id number]    Nursing Home Location:  [place] of Service: ALF [phone])  Chief Complaint  Patient presents with  . Acute Visit    lethargy    HPI:  82 y.o. female residing at [facility], memory care unit.She has a hx of SSS s/p pacemaker, AD, HTN, spinal stenosis,depression, and breast ca.   I was asked to see her for periods of sleepiness, unsteady gait, and weakness on 12/29. She had a a CBC and CMP which were normal, except the BUN/CR were slightly elevated at 27/0.95.  She has had periods of drowsiness and periods of agitation. There have been no reports of fever, cough, dysuria, diarrhea, constipation, one sided weakness, etc. She has lost 9 lbs in the past month due to decreased appetite, which may be in part due to drowsiness. She is currently on depakote.  This was decreased last week to 125 mg BID. The nurse reports that this morning she was very sleepy and then became more alert so the nurse gave her the morning dose of depakote. At that point she was able to get out of bed and eat breakfast and lunch as usual. She showed no signs of focal deficit. Her VS remained stable. After lunch she became very lethargic again when she was put to bed for a nap and staff was not able to arouse her without significant physical stimulus. Depakote level was 34.1 on 11/24/15.  A urine dip was performed last week and this week and remains unremarkable.  Review of Systems:  Review of Systems  Unable to perform ROS: Dementia    Medications: Patient's Medications  New Prescriptions   No medications on file  Previous Medications   ACETAMINOPHEN (TYLENOL) 500 MG TABLET    Take 500 mg by mouth every 8 (eight) hours as needed.   AMLODIPINE (NORVASC) 5 MG TABLET    Take 5 mg by mouth daily.   AMOXICILLIN (AMOXIL) 500 MG TABLET    Take 4 tablets by mouth one hour prior to dental   BISACODYL (DULCOLAX) 10 MG SUPPOSITORY    Place 10 mg rectally as needed for moderate constipation.   CHOLECALCIFEROL (VITAMIN D3) 2000 UNITS TABS    Take 1 tablet by mouth daily.   COLESEVELAM (WELCHOL) 625 MG TABLET    Take 625 mg by mouth daily. For cholesterol   DONEPEZIL (ARICEPT) 10 MG TABLET    Take 10 mg by mouth daily.    DULOXETINE (CYMBALTA) 60 MG CAPSULE    Take 1 capsule (60 mg total) by mouth daily.   GABAPENTIN (NEURONTIN) 100 MG CAPSULE    Take 1 capsule (100 mg total) by mouth at bedtime.   LORAZEPAM (ATIVAN) 0.5 MG TABLET    Take 0.25 mg by mouth every 6 (six) hours as needed for anxiety.   LOSARTAN-HYDROCHLOROTHIAZIDE (HYZAAR) 100-25 MG PER TABLET    Take 1 tablet by mouth daily.   MEMANTINE HCL ER (NAMENDA XR) 28 MG CP24    Take 28 mg by mouth every morning. For memory   METOPROLOL SUCCINATE (TOPROL-XL) 25 MG 24 HR TABLET    Take 25 mg by mouth daily.   MONTELUKAST (SINGULAIR) 10 MG TABLET    Take 10 mg by mouth at bedtime.  Modified Medications   No medications on file  Discontinued Medications   DIVALPROEX (DEPAKOTE SPRINKLE) 125 MG CAPSULE    Take 125  mg by mouth 2 (two) times daily.    QUETIAPINE (SEROQUEL) 50 MG TABLET    Take 50 mg by mouth 2 (two) times daily.     Physical Exam:  Filed Vitals:   12/01/15 1519  BP: 109/76  Pulse: 85  Temp: 96 F (35.6 C)  Resp: 16  Weight: 167 lb (75.751 kg)  SpO2: 91%   Wt Readings from Last 3 Encounters:  12/01/15 167 lb (75.751 kg)  09/12/15 175 lb (79.379 kg)  06/07/15 183 lb 3.2 oz (83.099 kg)     Physical Exam  Constitutional: She is well-developed, well-nourished, and in no distress. No distress.  HENT:  Head: Normocephalic and atraumatic.  Eyes: Conjunctivae are normal. Pupils are equal, round, and reactive to light. Right eye exhibits no discharge. Left eye exhibits no discharge.  Neck: Normal range of motion. Neck supple. No JVD present.  Cardiovascular: Normal rate and regular rhythm.   No murmur  heard. Pulmonary/Chest: Effort normal and breath sounds normal. No respiratory distress.  Abdominal: Soft. Bowel sounds are normal. She exhibits no distension and no mass. There is no tenderness.  rotund  Musculoskeletal: Normal range of motion. She exhibits no edema or tenderness.  Neurological: She is alert.  Obtunded, responds to sternal rub but not to verbal stimulus.  No obvious focal deficit but can not f/c.   Skin: Skin is warm and dry. She is not diaphoretic.  Psychiatric:  obtunded    Labs reviewed/Significant Diagnostic Results:  Basic Metabolic Panel:  Recent Labs  01/05/15 03/14/15 07/05/15  NA 138 134* 137  K 3.7 3.9 4.1  BUN 14 16 17   CREATININE 0.7 0.8 0.5   Liver Function Tests:  Recent Labs  01/05/15 03/14/15 06/16/15  AST 17 19 22   ALT 13 17 19   ALKPHOS 62 65 56   No results for input(s): LIPASE, AMYLASE in the last 8760 hours. No results for input(s): AMMONIA in the last 8760 hours. CBC:  Recent Labs  01/05/15 03/14/15 06/16/15  WBC 7.7 9.4 7.1  HGB 13.5 14.2 14.3  HCT 39 42 42  PLT 202 202 198    Lab Results  Component Value Date   TSH 1.91 01/05/2015   07/05/15: depakote level 42 11/25/15: depakote level 34.1  Assessment/Plan  1. Lethargy -d/c depakote and hold neurontin -? Medication effect, if no improvement over the next few hours as the depakote wears off she may need to go to the hospital for further evaluation -also wonder if she has become further dehydrated, will check BMP -urine neg, will check CXR to rule out pna  2. Dehydration -check BMP and address with IVF if necessary  3. Alzheimer's dementia with behavioral disturbance -progressive with further functional decline and weight loss -check TSH -continue aricept and namenda -will d/c aricept if weight loss continues     [name], ANP [facility] [phone]
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
## 2015-12-01 NOTE — ED Notes (Signed)
CBG 94, Erin-RN aware

## 2015-12-01 NOTE — ED Provider Notes (Signed)
MSE was initiated and I personally evaluated the patient and placed orders (if any) at  6:13 PM on December 01, 2015.  The patient appears stable so that the remainder of the MSE may be completed by another provider.  82 year old female was noted to have decreased level of consciousness since 1 PM and had a similar episode one week ago. EMS reports waxing and waning right facial droop and questionable right-sided weakness. Cursory exam shows questionable left facial droop and no obvious weakness. At the moment, I doubt acute ischemic stroke.  Delora Fuel, MD [phone]
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
## 2015-12-02 ENCOUNTER — Observation Stay (HOSPITAL_COMMUNITY): Payer: Medicare Other

## 2015-12-02 ENCOUNTER — Encounter (HOSPITAL_COMMUNITY): Payer: PRIVATE HEALTH INSURANCE

## 2015-12-02 ENCOUNTER — Observation Stay (HOSPITAL_BASED_OUTPATIENT_CLINIC_OR_DEPARTMENT_OTHER): Payer: Medicare Other

## 2015-12-02 DIAGNOSIS — E7800 Pure hypercholesterolemia, unspecified: Secondary | ICD-10-CM | POA: Diagnosis present

## 2015-12-02 DIAGNOSIS — R400 Somnolence: Secondary | ICD-10-CM | POA: Diagnosis not present

## 2015-12-02 DIAGNOSIS — R531 Weakness: Secondary | ICD-10-CM | POA: Diagnosis present

## 2015-12-02 DIAGNOSIS — Z90710 Acquired absence of both cervix and uterus: Secondary | ICD-10-CM | POA: Diagnosis not present

## 2015-12-02 DIAGNOSIS — N390 Urinary tract infection, site not specified: Secondary | ICD-10-CM | POA: Diagnosis present

## 2015-12-02 DIAGNOSIS — Z96649 Presence of unspecified artificial hip joint: Secondary | ICD-10-CM | POA: Diagnosis present

## 2015-12-02 DIAGNOSIS — G309 Alzheimer's disease, unspecified: Secondary | ICD-10-CM | POA: Diagnosis present

## 2015-12-02 DIAGNOSIS — Z853 Personal history of malignant neoplasm of breast: Secondary | ICD-10-CM | POA: Diagnosis not present

## 2015-12-02 DIAGNOSIS — R4182 Altered mental status, unspecified: Secondary | ICD-10-CM | POA: Diagnosis present

## 2015-12-02 DIAGNOSIS — Z87891 Personal history of nicotine dependence: Secondary | ICD-10-CM | POA: Diagnosis not present

## 2015-12-02 DIAGNOSIS — G9340 Encephalopathy, unspecified: Principal | ICD-10-CM

## 2015-12-02 DIAGNOSIS — R29810 Facial weakness: Secondary | ICD-10-CM | POA: Diagnosis present

## 2015-12-02 DIAGNOSIS — I739 Peripheral vascular disease, unspecified: Secondary | ICD-10-CM | POA: Diagnosis present

## 2015-12-02 DIAGNOSIS — I495 Sick sinus syndrome: Secondary | ICD-10-CM | POA: Diagnosis not present

## 2015-12-02 DIAGNOSIS — I679 Cerebrovascular disease, unspecified: Secondary | ICD-10-CM | POA: Diagnosis present

## 2015-12-02 DIAGNOSIS — I10 Essential (primary) hypertension: Secondary | ICD-10-CM | POA: Diagnosis not present

## 2015-12-02 DIAGNOSIS — Z9049 Acquired absence of other specified parts of digestive tract: Secondary | ICD-10-CM | POA: Diagnosis not present

## 2015-12-02 DIAGNOSIS — I6789 Other cerebrovascular disease: Secondary | ICD-10-CM | POA: Diagnosis not present

## 2015-12-02 DIAGNOSIS — G4733 Obstructive sleep apnea (adult) (pediatric): Secondary | ICD-10-CM | POA: Diagnosis present

## 2015-12-02 DIAGNOSIS — Z66 Do not resuscitate: Secondary | ICD-10-CM | POA: Diagnosis present

## 2015-12-02 DIAGNOSIS — R2990 Unspecified symptoms and signs involving the nervous system: Secondary | ICD-10-CM | POA: Diagnosis not present

## 2015-12-02 DIAGNOSIS — G2581 Restless legs syndrome: Secondary | ICD-10-CM | POA: Diagnosis present

## 2015-12-02 DIAGNOSIS — F0281 Dementia in other diseases classified elsewhere with behavioral disturbance: Secondary | ICD-10-CM | POA: Diagnosis present

## 2015-12-02 DIAGNOSIS — G20 Parkinson's disease: Secondary | ICD-10-CM | POA: Diagnosis not present

## 2015-12-02 DIAGNOSIS — E876 Hypokalemia: Secondary | ICD-10-CM | POA: Diagnosis not present

## 2015-12-02 DIAGNOSIS — I4891 Unspecified atrial fibrillation: Secondary | ICD-10-CM | POA: Diagnosis not present

## 2015-12-02 DIAGNOSIS — Z950 Presence of cardiac pacemaker: Secondary | ICD-10-CM | POA: Diagnosis not present

## 2015-12-02 DIAGNOSIS — G308 Other Alzheimer's disease: Secondary | ICD-10-CM | POA: Diagnosis not present

## 2015-12-02 DIAGNOSIS — N179 Acute kidney failure, unspecified: Secondary | ICD-10-CM | POA: Diagnosis not present

## 2015-12-02 LAB — LACTIC ACID, PLASMA
Lactic Acid, Venous: 1.6 mmol/L (ref 0.5–2.0)
Lactic Acid, Venous: 1.8 mmol/L (ref 0.5–2.0)

## 2015-12-02 LAB — TSH: TSH: 2.212 u[IU]/mL (ref 0.350–4.500)

## 2015-12-02 LAB — GLUCOSE, CAPILLARY
GLUCOSE-CAPILLARY: 102 mg/dL — AB (ref 65–99)
Glucose-Capillary: 129 mg/dL — ABNORMAL HIGH (ref 65–99)
Glucose-Capillary: 110 mg/dL — ABNORMAL HIGH (ref 65–99)
Glucose-Capillary: 109 mg/dL — ABNORMAL HIGH (ref 65–99)

## 2015-12-02 LAB — RAPID URINE DRUG SCREEN, HOSP PERFORMED
AMPHETAMINES: NOT DETECTED
BARBITURATES: NOT DETECTED
BENZODIAZEPINES: NOT DETECTED
Cocaine: NOT DETECTED
Opiates: NOT DETECTED
Tetrahydrocannabinol: NOT DETECTED

## 2015-12-02 LAB — LIPID PANEL
CHOLESTEROL: 173 mg/dL (ref 0–200)
HDL: 41 mg/dL (ref >40)
LDL CALC: 85 mg/dL (ref 0–99)
TRIGLYCERIDES: 237 mg/dL — AB (ref <150)
Total CHOL/HDL Ratio: 4.2 RATIO
VLDL: 47 mg/dL — ABNORMAL HIGH (ref 0–40)

## 2015-12-02 LAB — HIV ANTIBODY (ROUTINE TESTING W REFLEX): HIV Screen 4th Generation wRfx: NONREACTIVE

## 2015-12-02 LAB — AMMONIA: AMMONIA: 25 umol/L (ref 9–35)

## 2015-12-02 LAB — PHOSPHORUS: PHOSPHORUS: 3.2 mg/dL (ref 2.5–4.6)

## 2015-12-02 LAB — MRSA PCR SCREENING: MRSA BY PCR: NEGATIVE

## 2015-12-02 LAB — OSMOLALITY: OSMOLALITY: 292 mosm/kg (ref 275–295)

## 2015-12-02 LAB — ETHANOL: Alcohol, Ethyl (B): <5 mg/dL (ref <5)

## 2015-12-02 LAB — VITAMIN B12: VITAMIN B 12: 695 pg/mL (ref 180–914)

## 2015-12-02 LAB — MAGNESIUM: Magnesium: 1.5 mg/dL — ABNORMAL LOW (ref 1.7–2.4)

## 2015-12-02 MED ORDER — CIPROFLOXACIN IN D5W 400 MG/200ML IV SOLN
400.0000 mg | Freq: Two times a day (BID) | INTRAVENOUS | Status: DC
  Administered 2015-12-02 – 2015-12-03 (×3): 400 mg via INTRAVENOUS
  Filled 2015-12-02 (×3): qty 200

## 2015-12-02 MED ORDER — MAGNESIUM SULFATE 2 GM/50ML IV SOLN
2.0000 g | Freq: Once | INTRAVENOUS | Status: AC
  Administered 2015-12-02: 2 g via INTRAVENOUS
  Filled 2015-12-02: qty 50

## 2015-12-02 MED ORDER — CLOPIDOGREL BISULFATE 75 MG PO TABS
75.0000 mg | ORAL_TABLET | Freq: Every day | ORAL | Status: DC
  Administered 2015-12-02 – 2015-12-06 (×5): 75 mg via ORAL
  Filled 2015-12-02 (×5): qty 1

## 2015-12-02 NOTE — Procedures (Signed)
ELECTROENCEPHALOGRAM REPORT  Date of Study: 12/02/2015  Patient's Name: Sydney Arroyo MRN: [id number] Date of Birth: 1931/08/19  Referring Provider: Dr. Etta Quill  Clinical History: This is an 82 year old woman with lethargy and increased confusion.  Medications: acetaminophen (TYLENOL) tablet 650 mg amLODipine (NORVASC) tablet 5 mg cholecalciferol (VITAMIN D) tablet 1,000 Units ciprofloxacin (CIPRO) IVPB 400 mg clopidogrel (PLAVIX) tablet 75 mg colesevelam [facility]) tablet 625 mg donepezil (ARICEPT) tablet 10 mg DULoxetine (CYMBALTA) DR capsule 30 mg hydrochlorothiazide (HYDRODIURIL) tablet 25 mg LORazepam (ATIVAN) tablet 0.25 mg losartan (COZAAR) tablet 100 mg memantine (NAMENDA XR) 24 hr capsule 28 mg metoprolol succinate (TOPROL-XL) 24 hr tablet 25 mg montelukast (SINGULAIR) tablet 10 mg  Technical Summary: A multichannel digital EEG recording measured by the international 10-20 system with electrodes applied with paste and impedances below 5000 ohms performed as portable with EKG monitoring in an awake and asleep patient.  Hyperventilation and photic stimulation were not performed.  The digital EEG was referentially recorded, reformatted, and digitally filtered in a variety of bipolar and referential montages for optimal display.   Description: The patient is awake and asleep during the recording. There is no clear posterior dominant rhythm. The background consists of a moderate amount of diffuse 4-6 Hz theta and 2-3 Hz delta slowing.  During drowsiness and sleep, there is an increase in theta and delta slowing of the background with vertex waves seen.  Hyperventilation and photic stimulation were not performed.  There were no epileptiform discharges or electrographic seizures seen.    EKG lead was unremarkable.  Impression: This awake and asleep EEG is abnormal due to moderate diffuse slowing of the waking background.  Clinical Correlation of the above findings  indicates diffuse cerebral dysfunction that is non-specific in etiology and can be seen with hypoxic/ischemic injury, toxic/metabolic encephalopathies, neurodegenerative disorders, or medication effect.  The absence of epileptiform discharges does not rule out a clinical diagnosis of epilepsy.  Clinical correlation is advised.   Ellouise Newer, M.D.

## 2015-12-02 NOTE — Evaluation (Signed)
Occupational Therapy Evaluation Patient Details Name: Sydney Arroyo MRN: [id number] DOB: [birth year] Today's Date: 12/02/2015    History of Present Illness Pt adm with incr lethargy and confusion. Pt found to have UTI. Neurology work up and pt not felt to have CVA. PMH - alzheimers dementia, breast CA, vertigo, pacer   Clinical Impression   Unsure of patient's PLOF PTA. Patient currently functioning at an overall min assist for functional mobility and overall total assist level for ADLs due to poor cognition, unsure of baseline. Patient will benefit from acute OT to increase overall independence in the areas of ADLs, functional mobility, and overall safety in order to safely discharge back to ALF if they are able to provide the necessary amount of care.     Follow Up Recommendations  Supervision/Assistance - 24 hour (back to ALF memory care unit if they are able to provide the appropriate level and need of care post acute)    Equipment Recommendations  Other (comment) (TBD)    Recommendations for Other Services  None at this time   Precautions / Restrictions Precautions Precautions: Fall Restrictions Weight Bearing Restrictions: No    Mobility Bed Mobility Overal bed mobility: Needs Assistance Bed Mobility: Supine to Sit     Supine to sit: Min assist     General bed mobility comments: Min assist for steadying and initiation   Transfers Overall transfer level: Needs assistance Equipment used: None Transfers: Sit to/from [employer] Sit to Stand: Min assist   Squat pivot transfers: Min assist     General transfer comment: min assist to steady and initiation    Balance Overall balance assessment: Needs assistance Sitting-balance support: No upper extremity supported;Feet supported Sitting balance-Leahy Scale: Fair     Standing balance support: No upper extremity supported;During functional activity Standing balance-Leahy Scale: Poor    ADL  Overall ADL's : Needs assistance/impaired     Grooming: Moderate assistance;Sitting   Upper Body Bathing: Total assistance;Sitting   Lower Body Bathing: Total assistance;Sit to/from stand   Upper Body Dressing : Total assistance;Sitting   Lower Body Dressing: Total assistance;Sit to/from stand   Toilet Transfer: Minimal assistance;BSC;Cueing for safety;Stand-pivot   Toileting- Clothing Manipulation and Hygiene: Total assistance       Functional mobility during ADLs: Minimal assistance (stand pivot EOB to BSC, then BSC to recliner; no AD used) General ADL Comments: Pt requires increased assistance secondary to poor cognition. Unsure of patient's PLOF and how she was functioning PTA.     Vision Additional Comments: unsure          Pertinent Vitals/Pain Pain Assessment: No/denies pain Faces Pain Scale: No hurt     Hand Dominance Unsure   Extremity/Trunk Assessment Upper Extremity Assessment Upper Extremity Assessment: Generalized weakness   Lower Extremity Assessment Lower Extremity Assessment: Generalized weakness  Communication Communication Communication: No difficulties   Cognition Arousal/Alertness: Awake/alert Behavior During Therapy: Flat affect Overall Cognitive Status: No family/caregiver present to determine baseline cognitive functioning Area of Impairment: Orientation;Attention;Problem solving;Following commands Orientation Level: Disoriented to;Place;Time;Situation;Person Current Attention Level: Focused Memory: Decreased short-term memory Following Commands: Follows one step commands inconsistently;Follows one step commands with increased time     Problem Solving: Slow processing;Decreased initiation;Requires verbal cues General Comments: Pt more awake this session. Therapist asked patient to don socks, pt just looked at socks and started messing with them, unable to follow command.               Home Living Family/patient expects to be  discharged  to:: Assisted living Home Equipment:  (pt unable to state)   Additional Comments: pt from [facility]      Prior Functioning/Environment Comments: Unknown.    OT Diagnosis: Generalized weakness;Cognitive deficits   OT Problem List: Decreased strength;Decreased activity tolerance;Impaired balance (sitting and/or standing);Decreased safety awareness;Decreased knowledge of use of DME or AE;Decreased knowledge of precautions   OT Treatment/Interventions: Self-care/ADL training;Therapeutic exercise;Energy conservation;DME and/or AE instruction;Therapeutic activities;Patient/family education;Balance training    OT Goals(Current goals can be found in the care plan section) Acute Rehab OT Goals Patient Stated Goal: Pt unable OT Goal Formulation: Patient unable to participate in goal setting Time For Goal Achievement: 12/16/15 Potential to Achieve Goals: Good ADL Goals Pt Will Perform Grooming: with supervision;standing Pt Will Perform Lower Body Bathing: with supervision;sit to/from stand Pt Will Perform Lower Body Dressing: with supervision;sit to/from stand Pt Will Transfer to Toilet: with supervision;bedside commode;ambulating  OT Frequency: Min 2X/week   Barriers to D/C: unsure how much assistance ALF provides    End of Session Nurse Communication: Mobility status  Activity Tolerance: Patient tolerated treatment well Patient left: in chair;with call bell/phone within reach;with chair alarm set;with nursing/sitter in room   Time: 1440-1456 OT Time Calculation (min): 16 min Charges:  OT General Charges $OT Visit: 1 Procedure OT Evaluation $OT Eval Moderate Complexity: 1 Procedure G-Codes: OT G-codes **NOT FOR INPATIENT CLASS** Functional Limitation: Self care Self Care Current Status [id number]): At least 80 percent but less than 100 percent impaired, limited or restricted Self Care Goal Status [id number]): At least 1 percent but less than 20 percent impaired,  limited or restricted  Chrys Racer 12/02/2015, 3:39 PM

## 2015-12-02 NOTE — Evaluation (Signed)
Physical Therapy Evaluation Patient Details Name: Sydney Arroyo MRN: [id number] DOB: Feb 10, 1931 Today's Date: 12/02/2015   History of Present Illness  Pt adm with incr lethargy and confusion. Pt found to have UTI. Neurology work up and pt not felt to have CVA. PMH - alzheimers dementia, breast CA, vertigo, pacer  Clinical Impression  Pt admitted with above diagnosis and presents to PT with functional limitations due to deficits listed below (See PT problem list). Pt needs skilled PT to maximize independence and safety to allow discharge to memory care unit.      Follow Up Recommendations Other (comment) (Return to memory care )    Equipment Recommendations  None recommended by PT    Recommendations for Other Services       Precautions / Restrictions Precautions Precautions: Fall Restrictions Weight Bearing Restrictions: No      Mobility  Bed Mobility Overal bed mobility: Needs Assistance Bed Mobility: Supine to Sit;Sit to Supine     Supine to sit: Mod assist Sit to supine: Min assist   General bed mobility comments: assist to bring legs off bed and to elevate trunk from bed  Transfers Overall transfer level: Needs assistance Equipment used: Rolling walker (2 wheeled) Transfers: Sit to/from Stand Sit to Stand: Min assist         General transfer comment: Assist to bring hips up  Ambulation/Gait Ambulation/Gait assistance: Min assist Ambulation Distance (Feet): 2 Feet Assistive device: Rolling walker (2 wheeled) Gait Pattern/deviations: Step-through pattern;Decreased step length - right;Decreased step length - left;Shuffle Gait velocity: decr Gait velocity interpretation: <1.8 ft/sec, indicative of risk for recurrent falls General Gait Details: Assist for balance and support. Distance limited because transport to take pt for test  Stairs            Wheelchair Mobility    Modified Rankin (Stroke Patients Only)       Balance Overall balance  assessment: Needs assistance Sitting-balance support: No upper extremity supported;Feet supported Sitting balance-Leahy Scale: Fair     Standing balance support: Bilateral upper extremity supported Standing balance-Leahy Scale: Poor Standing balance comment: support of walker and min guard for static standing                             Pertinent Vitals/Pain Pain Assessment: Faces Faces Pain Scale: No hurt    Home Living Family/patient expects to be discharged to:: Assisted living               Home Equipment:  (Pt unable to state) Additional Comments: pt from [facility]    Prior Function           Comments: Unknown.     Hand Dominance        Extremity/Trunk Assessment   Upper Extremity Assessment: Defer to OT evaluation           Lower Extremity Assessment: Generalized weakness         Communication      Cognition Arousal/Alertness: Lethargic Behavior During Therapy: Flat affect Overall Cognitive Status: No family/caregiver present to determine baseline cognitive functioning Area of Impairment: Orientation;Attention;Problem solving Orientation Level: Disoriented to;Place;Time;Situation Current Attention Level: Focused Memory: Decreased short-term memory       Problem Solving: Slow processing;Decreased initiation;Requires verbal cues General Comments: Arouses with stimuli but quickly back to sleep when not stimulated    General Comments      Exercises        Assessment/Plan  PT Assessment Patient needs continued PT services  PT Diagnosis Difficulty walking;Generalized weakness   PT Problem List Decreased strength;Decreased activity tolerance;Decreased balance;Decreased mobility  PT Treatment Interventions DME instruction;Gait training;Functional mobility training;Therapeutic activities;Therapeutic exercise;Balance training;Patient/family education   PT Goals (Current goals can be found in the Care Plan  section) Acute Rehab PT Goals Patient Stated Goal: Pt unable PT Goal Formulation: Patient unable to participate in goal setting Time For Goal Achievement: 12/09/15 Potential to Achieve Goals: Good    Frequency Min 3X/week   Barriers to discharge        Co-evaluation               End of Session Equipment Utilized During Treatment: Gait belt Activity Tolerance: Patient limited by lethargy Patient left: in bed;with call bell/phone within reach;Other (comment) (being transported to test) Nurse Communication: Mobility status         Time: [id number] PT Time Calculation (min) (ACUTE ONLY): 13 min   Charges:   PT Evaluation $PT Eval Moderate Complexity: 1 Procedure     PT G Codes:        Loy Little 12-25-15, 10:24 AM Suanne Marker PT [phone]

## 2015-12-02 NOTE — Progress Notes (Signed)
EEG completed; results pending.    

## 2015-12-02 NOTE — Consult Note (Signed)
Subjective: Extremely drowsy and falls to sleep if not stimulated. No complaints.   Exam: Filed Vitals:   12/02/15 0323 12/02/15 0515  BP: 110/58 130/62  Pulse: 67 64  Temp: 97.8 F (36.6 C) 97.8 F (36.6 C)  Resp: 18 18       Gen: In bed, NAD MS: lethargic, not oriented to place, year, date, why she is in hospital CN: Face equal and symmetrical, PERRLA, EOMI, TML Motor: moving extremities antigravity and equally Sensory: intact throughout   Pertinent Labs: LDL 85 THS WNL AMMONIA WNL B12 WNL  IMPRESSION: CTA NECK IMPRESSION:  1. Atheromatous plaque at the right carotid bifurcation/proximal right ICA with associated short segment stenosis of 80-90% by NASCET criteria at the origin of the right ICA. 2. Atheromatous stenosis of approximately 50% at the origin of the left ICA. 3. Widely patent vertebral arteries.  CTA HEAD IMPRESSION:  1. No large or proximal arterial branch occlusion. 2. Short-segment moderate proximal left M2 stenosis as above. 3. 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 CT PERFUSION:  1. No perfusion mismatch to suggest acute ischemia. 2. 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  Critical Value/emergent results were called by telephone at the time of interpretation on 12/01/2015 at 10:49 pm to Dr. Armida Sans , who verbally acknowledged these results     Etta Quill PA-C [facility] [phone]  Impression: 82 y.o. female patient who was brought into the emergency room by EMTs to evaluate for possible acute stroke. CTA head and neck shows a significant stenosis of RICA but no correlating CVA. In addition this would no coincide with right sided weakness. 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 NO CVA on perfusion scan.  UA shows many bacteria with leukocytes.    Recommendations: 1)  Treat underlying UTI and decrease sedating medications.     12/02/2015, 9:16 AM

## 2015-12-02 NOTE — Progress Notes (Signed)
PROGRESS NOTE  MALANII PRUNEAU [id number] DOB: Jan 25, 1931 DOA: 12/01/2015 PCP: Hollace Kinnier, DO  Assessment/Plan: Acute encephalopathy  - Dr. Silverio Decamp of neuro consulting and much appreciated  - Will stop gabapentin, Seroquel, and Depakote  - Monitor overnight with frequent neuro checks  -TSH, B12, ammonia ok - CTA: PCOMM aneurysm that will need to be followed with repeat CTA in one year-- family will probably elect not to follow - Urine is turbid with many bacteria; no other systemic sign of infxn and belly not tender; may be colonization only, but given the clinical scenario, will treat with Cipro empirically; culture not sent untial after abx  Alzheimer Dementia with behavior disturbance  - Avoiding Seroquel and Depakote as above  - Neuro consultant notes that exam consistent with PD, planning to start amantadine?   Hypertension  - Continue home Norvasc, losartan-HCTZ, metoprolol  - BP well-controlled on this regimen currently, monitoring   Sick sinus syndrome  - Has pacer in place  - Monitoring on tele, has been NSR  From my discussions with MPOA she appears to be at baseline  Code Status: DNR Family Communication: called barry johnson Disposition Plan: from memory care- PT eval-- from [facility]   Consultants:  neuro  Procedures:      HPI/Subjective: Opens eyes, smiles,, says a few words only  Objective: Filed Vitals:   12/02/15 0515 12/02/15 0934  BP: 130/62 137/63  Pulse: 64 72  Temp: 97.8 F (36.6 C)   Resp: 18 18    Intake/Output Summary (Last 24 hours) at 12/02/15 1335 Last data filed at 12/02/15 0927  Gross per 24 hour  Intake    200 ml  Output      0 ml  Net    200 ml   Filed Weights   12/01/15 1845 12/01/15 2228  Weight: 75.751 kg (167 lb) 75.9 kg (167 lb 5.3 oz)    Exam:   General:  Opens eyes, says a few words  Cardiovascular: rrr  Respiratory: clear  Abdomen: +BS, soft  Musculoskeletal: no edema   Data  Reviewed: Basic Metabolic Panel:  Recent Labs Lab 12/01/15 1815 12/01/15 1818 12/01/15 2338  NA 142 142  --   K 4.6 5.0  --   CL 101 103  --   CO2 30  --   --   GLUCOSE 105* 99  --   BUN 17 27*  --   CREATININE 0.71 0.60  --   CALCIUM 9.7  --   --   MG  --   --  1.5*  PHOS  --   --  3.2   Liver Function Tests:  Recent Labs Lab 12/01/15 1815  AST 55*  ALT 27  ALKPHOS 62  BILITOT 1.1  PROT 6.4*  ALBUMIN 3.2*   No results for input(s): LIPASE, AMYLASE in the last 168 hours.  Recent Labs Lab 12/01/15 2338  AMMONIA 25   CBC:  Recent Labs Lab 12/01/15 1815 12/01/15 1818  WBC 7.3  --   NEUTROABS 3.7  --   HGB 16.5* 16.7*  HCT 48.8* 49.0*  MCV 101.5*  --   PLT 128*  --    Cardiac Enzymes: No results for input(s): CKTOTAL, CKMB, CKMBINDEX, TROPONINI in the last 168 hours. BNP (last 3 results) No results for input(s): BNP in the last 8760 hours.  ProBNP (last 3 results) No results for input(s): PROBNP in the last 8760 hours.  CBG:  Recent Labs Lab 12/01/15 1932 12/02/15 0813 12/02/15 1227  GLUCAP 94 102* 129*    Recent Results (from the past 240 hour(s))  MRSA PCR Screening     Status: None   Collection Time: 12/02/15  3:55 AM  Result Value Ref Range Status   MRSA by PCR NEGATIVE NEGATIVE Final    Comment:        The GeneXpert MRSA Assay (FDA approved for NASAL specimens only), is one component of a comprehensive MRSA colonization surveillance program. It is not intended to diagnose MRSA infection nor to guide or monitor treatment for MRSA infections.      Studies: Ct Angio Head W/cm &/or Wo Cm  12/01/2015  CLINICAL DATA:  Initial evaluation for possible right facial droop and right-sided weakness. EXAM: CT ANGIOGRAPHY HEAD AND NECK TECHNIQUE: Multidetector CT imaging of the head and neck was performed using the standard protocol during bolus administration of intravenous contrast. Multiplanar CT image reconstructions and MIPs were  obtained to evaluate the vascular anatomy. Carotid stenosis measurements (when applicable) are obtained utilizing NASCET criteria, using the distal internal carotid diameter as the denominator. CONTRAST:  138mL OMNIPAQUE IOHEXOL 350 MG/ML SOLN COMPARISON:  Prior head CT from earlier the same day. FINDINGS: CTA NECK Aortic arch: Visualized aortic arch is of normal caliber with normal 3 vessel morphology. Scattered atheromatous plaque present within the arch itself without stenosis. Scattered plaque present within the proximal subclavian arteries without high-grade flow-limiting stenosis. Right carotid system: Origin of right common carotid artery widely patent. The proximal right common carotid artery is tortuous proximally. Right common carotid artery widely patent to its distal aspect. Atheromatous plaque within the distal right common carotid artery prior to the bifurcation results in moderate stenosis (approximately 40-50%. Distally, there is extensive atheromatous plaque about the right carotid bifurcation/proximal right ICA. There is short-segment atheromatous stenosis at the origin of the right ICA avid least 80-90% by NASCET criteria (series 502, image 215). Distally, right ICA widely patent to the skullbase. Left carotid system: Left common carotid artery patent from its origin to the bifurcation. Scattered calcified plaque about the left carotid bifurcation/proximal left ICA with associated short segment stenosis of approximately 50% by NASCET criteria. Distally, left ICA is tortuous but widely patent to the skullbase. Vertebral arteries:Both vertebral arteries arise from the subclavian arteries. Origin of the vertebral arteries widely patent. Pre foraminal V1 segments are tortuous. Vertebral arteries patent along their entire course without focal stenosis, dissection, or vascular occlusion. Skeleton: Reversal of the normal cervical lordosis. Trace anterolisthesis of C2 on C3. Advanced degenerative  spondylolysis at C4-5 and C6-7. No acute osseous abnormality. Other neck: Visualized lungs demonstrate no acute process. Partially visualized superior mediastinum normal. Subcentimeter hypodense nodule within the right lobe of thyroid noted, of doubtful clinical significance. No adenopathy. No acute soft tissue abnormality within the neck. CTA HEAD Anterior circulation: Petrous segments widely patent. Smooth atheromatous plaque throughout the carotid siphons with resultant mild diffuse narrowing. Plaque extends into the supraclinoid segments without significant stenosis. Left A1 segment slightly hypoplastic but widely patent. Right A1 segment patent. Anterior communicating artery normal. Anterior cerebral arteries opacified to their distal aspects. M1 segments widely patent without stenosis or occlusion. MCA bifurcations normal. No proximal M2 branch occlusion. Short short-segment moderate stenosis within a proximal left M2 branch, seen best on MIP reconstructions (series 507, image 20). MCA branches well opacified distally and symmetric. Posterior circulation: Vertebral arteries patent to the vertebrobasilar junction. Posterior inferior cerebral arteries patent. Basilar artery tortuous but widely patent. Superior cerebellar arteries well opacified bilaterally. Both posterior cerebral  arteries arise from the basilar artery. PCAs resting the basilar artery and are well opacified to their distal aspects. Mild distal small vessel changes within the distal PCAs. Venous sinuses: Patent without venous sinus thrombosis. Anatomic variants: No anatomic variant. 3 x 3 x 4 mm focal outpouching arising from the supraclinoid right ICA consistent with a small posterior communicating artery aneurysm (series 504, image 94). Delayed phase: No abnormal enhancement on delayed sequence. CT PERFUSION: No perfusion mismatch to suggest acute ischemia identified. Subtle elevated mean transit time and time to peak within the anterior left  MCA distribution, like related to the previously identified proximal left M2 branch stenosis. No other perfusion abnormality. IMPRESSION: CTA NECK IMPRESSION: 1. Atheromatous plaque at the right carotid bifurcation/proximal right ICA with associated short segment stenosis of 80-90% by NASCET criteria at the origin of the right ICA. 2. Atheromatous stenosis of approximately 50% at the origin of the left ICA. 3. Widely patent vertebral arteries. CTA HEAD IMPRESSION: 1. No large or proximal arterial branch occlusion. 2. Short-segment moderate proximal left M2 stenosis as above. 3. 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 CT PERFUSION: 1. No perfusion mismatch to suggest acute ischemia. 2. 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 Critical Value/emergent results were called by telephone at the time of interpretation on 12/01/2015 at 10:49 pm to Dr. Armida Sans , who verbally acknowledged these results. Electronically Signed   By: Jeannine Boga M.D.   On: 12/01/2015 23:05   Ct Head Wo Contrast  12/01/2015  CLINICAL DATA:  Code stroke. Right-sided weakness and slurred speech. EXAM: CT HEAD WITHOUT CONTRAST TECHNIQUE: Contiguous axial images were obtained from the base of the skull through the vertex without intravenous contrast. COMPARISON:  None. FINDINGS: Generalized atrophy and chronic small vessel ischemia. Slightly more confluent ill-defined hypodensity and obscuration of gray-white differentiation in the periventricular left frontal lobe. No intracranial hemorrhage. No intracranial or extra-axial fluid collection. No hydrocephalus. Basal cisterns are patent. The calvarium is intact. Included paranasal sinuses and mastoid air cells are well aerated. IMPRESSION: 1. Subtle ill-defined hypodensity within the left periventricular white matter, concerning for acute ischemia. There are no prior exams available for comparison. 2.  Background atrophy and chronic small vessel ischemic change. These results were called by telephone at the time of interpretation on 12/01/2015 at 6:30 pm to Dr. Silverio Decamp , who verbally acknowledged these results. Electronically Signed   By: Jeb Levering M.D.   On: 12/01/2015 18:32   Ct Angio Neck W/cm &/or Wo/cm  12/01/2015  CLINICAL DATA:  Initial evaluation for possible right facial droop and right-sided weakness. EXAM: CT ANGIOGRAPHY HEAD AND NECK TECHNIQUE: Multidetector CT imaging of the head and neck was performed using the standard protocol during bolus administration of intravenous contrast. Multiplanar CT image reconstructions and MIPs were obtained to evaluate the vascular anatomy. Carotid stenosis measurements (when applicable) are obtained utilizing NASCET criteria, using the distal internal carotid diameter as the denominator. CONTRAST:  18mL OMNIPAQUE IOHEXOL 350 MG/ML SOLN COMPARISON:  Prior head CT from earlier the same day. FINDINGS: CTA NECK Aortic arch: Visualized aortic arch is of normal caliber with normal 3 vessel morphology. Scattered atheromatous plaque present within the arch itself without stenosis. Scattered plaque present within the proximal subclavian arteries without high-grade flow-limiting stenosis. Right carotid system: Origin of right common carotid artery widely patent. The proximal right common carotid artery is tortuous proximally. Right common  carotid artery widely patent to its distal aspect. Atheromatous plaque within the distal right common carotid artery prior to the bifurcation results in moderate stenosis (approximately 40-50%. Distally, there is extensive atheromatous plaque about the right carotid bifurcation/proximal right ICA. There is short-segment atheromatous stenosis at the origin of the right ICA avid least 80-90% by NASCET criteria (series 502, image 215). Distally, right ICA widely patent to the skullbase. Left carotid system: Left common carotid artery  patent from its origin to the bifurcation. Scattered calcified plaque about the left carotid bifurcation/proximal left ICA with associated short segment stenosis of approximately 50% by NASCET criteria. Distally, left ICA is tortuous but widely patent to the skullbase. Vertebral arteries:Both vertebral arteries arise from the subclavian arteries. Origin of the vertebral arteries widely patent. Pre foraminal V1 segments are tortuous. Vertebral arteries patent along their entire course without focal stenosis, dissection, or vascular occlusion. Skeleton: Reversal of the normal cervical lordosis. Trace anterolisthesis of C2 on C3. Advanced degenerative spondylolysis at C4-5 and C6-7. No acute osseous abnormality. Other neck: Visualized lungs demonstrate no acute process. Partially visualized superior mediastinum normal. Subcentimeter hypodense nodule within the right lobe of thyroid noted, of doubtful clinical significance. No adenopathy. No acute soft tissue abnormality within the neck. CTA HEAD Anterior circulation: Petrous segments widely patent. Smooth atheromatous plaque throughout the carotid siphons with resultant mild diffuse narrowing. Plaque extends into the supraclinoid segments without significant stenosis. Left A1 segment slightly hypoplastic but widely patent. Right A1 segment patent. Anterior communicating artery normal. Anterior cerebral arteries opacified to their distal aspects. M1 segments widely patent without stenosis or occlusion. MCA bifurcations normal. No proximal M2 branch occlusion. Short short-segment moderate stenosis within a proximal left M2 branch, seen best on MIP reconstructions (series 507, image 20). MCA branches well opacified distally and symmetric. Posterior circulation: Vertebral arteries patent to the vertebrobasilar junction. Posterior inferior cerebral arteries patent. Basilar artery tortuous but widely patent. Superior cerebellar arteries well opacified bilaterally. Both  posterior cerebral arteries arise from the basilar artery. PCAs resting the basilar artery and are well opacified to their distal aspects. Mild distal small vessel changes within the distal PCAs. Venous sinuses: Patent without venous sinus thrombosis. Anatomic variants: No anatomic variant. 3 x 3 x 4 mm focal outpouching arising from the supraclinoid right ICA consistent with a small posterior communicating artery aneurysm (series 504, image 94). Delayed phase: No abnormal enhancement on delayed sequence. CT PERFUSION: No perfusion mismatch to suggest acute ischemia identified. Subtle elevated mean transit time and time to peak within the anterior left MCA distribution, like related to the previously identified proximal left M2 branch stenosis. No other perfusion abnormality. IMPRESSION: CTA NECK IMPRESSION: 1. Atheromatous plaque at the right carotid bifurcation/proximal right ICA with associated short segment stenosis of 80-90% by NASCET criteria at the origin of the right ICA. 2. Atheromatous stenosis of approximately 50% at the origin of the left ICA. 3. Widely patent vertebral arteries. CTA HEAD IMPRESSION: 1. No large or proximal arterial branch occlusion. 2. Short-segment moderate proximal left M2 stenosis as above. 3. 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 CT PERFUSION: 1. No perfusion mismatch to suggest acute ischemia. 2. 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 Critical Value/emergent results were called by telephone at the time of interpretation on 12/01/2015 at 10:49 pm to Dr. Armida Sans , who verbally acknowledged these results. Electronically Signed   By: Jeannine Boga  M.D.   On: 12/01/2015 23:05   Ct Cerebral Perfusion W/cm  12/01/2015  CLINICAL DATA:  Initial evaluation for possible right facial droop and right-sided weakness. EXAM: CT ANGIOGRAPHY HEAD AND NECK TECHNIQUE: Multidetector CT imaging of the head  and neck was performed using the standard protocol during bolus administration of intravenous contrast. Multiplanar CT image reconstructions and MIPs were obtained to evaluate the vascular anatomy. Carotid stenosis measurements (when applicable) are obtained utilizing NASCET criteria, using the distal internal carotid diameter as the denominator. CONTRAST:  155mL OMNIPAQUE IOHEXOL 350 MG/ML SOLN COMPARISON:  Prior head CT from earlier the same day. FINDINGS: CTA NECK Aortic arch: Visualized aortic arch is of normal caliber with normal 3 vessel morphology. Scattered atheromatous plaque present within the arch itself without stenosis. Scattered plaque present within the proximal subclavian arteries without high-grade flow-limiting stenosis. Right carotid system: Origin of right common carotid artery widely patent. The proximal right common carotid artery is tortuous proximally. Right common carotid artery widely patent to its distal aspect. Atheromatous plaque within the distal right common carotid artery prior to the bifurcation results in moderate stenosis (approximately 40-50%. Distally, there is extensive atheromatous plaque about the right carotid bifurcation/proximal right ICA. There is short-segment atheromatous stenosis at the origin of the right ICA avid least 80-90% by NASCET criteria (series 502, image 215). Distally, right ICA widely patent to the skullbase. Left carotid system: Left common carotid artery patent from its origin to the bifurcation. Scattered calcified plaque about the left carotid bifurcation/proximal left ICA with associated short segment stenosis of approximately 50% by NASCET criteria. Distally, left ICA is tortuous but widely patent to the skullbase. Vertebral arteries:Both vertebral arteries arise from the subclavian arteries. Origin of the vertebral arteries widely patent. Pre foraminal V1 segments are tortuous. Vertebral arteries patent along their entire course without focal  stenosis, dissection, or vascular occlusion. Skeleton: Reversal of the normal cervical lordosis. Trace anterolisthesis of C2 on C3. Advanced degenerative spondylolysis at C4-5 and C6-7. No acute osseous abnormality. Other neck: Visualized lungs demonstrate no acute process. Partially visualized superior mediastinum normal. Subcentimeter hypodense nodule within the right lobe of thyroid noted, of doubtful clinical significance. No adenopathy. No acute soft tissue abnormality within the neck. CTA HEAD Anterior circulation: Petrous segments widely patent. Smooth atheromatous plaque throughout the carotid siphons with resultant mild diffuse narrowing. Plaque extends into the supraclinoid segments without significant stenosis. Left A1 segment slightly hypoplastic but widely patent. Right A1 segment patent. Anterior communicating artery normal. Anterior cerebral arteries opacified to their distal aspects. M1 segments widely patent without stenosis or occlusion. MCA bifurcations normal. No proximal M2 branch occlusion. Short short-segment moderate stenosis within a proximal left M2 branch, seen best on MIP reconstructions (series 507, image 20). MCA branches well opacified distally and symmetric. Posterior circulation: Vertebral arteries patent to the vertebrobasilar junction. Posterior inferior cerebral arteries patent. Basilar artery tortuous but widely patent. Superior cerebellar arteries well opacified bilaterally. Both posterior cerebral arteries arise from the basilar artery. PCAs resting the basilar artery and are well opacified to their distal aspects. Mild distal small vessel changes within the distal PCAs. Venous sinuses: Patent without venous sinus thrombosis. Anatomic variants: No anatomic variant. 3 x 3 x 4 mm focal outpouching arising from the supraclinoid right ICA consistent with a small posterior communicating artery aneurysm (series 504, image 94). Delayed phase: No abnormal enhancement on delayed  sequence. CT PERFUSION: No perfusion mismatch to suggest acute ischemia identified. Subtle elevated mean transit time and time to peak within  the anterior left MCA distribution, like related to the previously identified proximal left M2 branch stenosis. No other perfusion abnormality. IMPRESSION: CTA NECK IMPRESSION: 1. Atheromatous plaque at the right carotid bifurcation/proximal right ICA with associated short segment stenosis of 80-90% by NASCET criteria at the origin of the right ICA. 2. Atheromatous stenosis of approximately 50% at the origin of the left ICA. 3. Widely patent vertebral arteries. CTA HEAD IMPRESSION: 1. No large or proximal arterial branch occlusion. 2. Short-segment moderate proximal left M2 stenosis as above. 3. 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 CT PERFUSION: 1. No perfusion mismatch to suggest acute ischemia. 2. 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 Critical Value/emergent results were called by telephone at the time of interpretation on 12/01/2015 at 10:49 pm to Dr. Armida Sans , who verbally acknowledged these results. Electronically Signed   By: Jeannine Boga M.D.   On: 12/01/2015 23:05    Scheduled Meds: . amLODipine  5 mg Oral Daily  . cholecalciferol  1,000 Units Oral Daily  . ciprofloxacin  400 mg Intravenous Q12H  . clopidogrel  75 mg Oral Daily  . colesevelam  625 mg Oral Daily  . donepezil  10 mg Oral Daily  . DULoxetine  30 mg Oral Daily  . enoxaparin (LOVENOX) injection  40 mg Subcutaneous Q24H  . hydrochlorothiazide  25 mg Oral Daily  . losartan  100 mg Oral Daily  . memantine  28 mg Oral Daily  . metoprolol succinate  25 mg Oral Daily  . montelukast  10 mg Oral QHS   Continuous Infusions:  Antibiotics Given (last 72 hours)    Date/Time Action Medication Dose Rate   12/02/15 0417 Given   ciprofloxacin (CIPRO) IVPB 400 mg 400 mg 200 mL/hr      Principal  Problem:   Encephalopathy acute Active Problems:   Hypertension   Sick sinus syndrome (HCC)   Atrial fibrillation, new onset ([place])   Alzheimer's dementia with behavioral disturbance   Acute encephalopathy    Time spent: 25 min    [facility] Pager [phone]. If 7PM-7AM, please contact night-coverage at [email or web address], password [facility] 12/02/2015, 1:35 PM

## 2015-12-02 NOTE — Progress Notes (Signed)
  Echocardiogram 2D Echocardiogram has been performed.  Johny Chess 12/02/2015, 2:35 PM

## 2015-12-03 DIAGNOSIS — I4891 Unspecified atrial fibrillation: Secondary | ICD-10-CM

## 2015-12-03 LAB — BASIC METABOLIC PANEL
Anion gap: 11 (ref 5–15)
BUN: 13 mg/dL (ref 6–20)
CO2: 30 mmol/L (ref 22–32)
Calcium: 9.3 mg/dL (ref 8.9–10.3)
Chloride: 96 mmol/L — ABNORMAL LOW (ref 101–111)
Creatinine, Ser: 0.74 mg/dL (ref 0.44–1.00)
Glucose, Bld: 99 mg/dL (ref 65–99)
POTASSIUM: 3.7 mmol/L (ref 3.5–5.1)
SODIUM: 137 mmol/L (ref 135–145)

## 2015-12-03 LAB — CBC
HEMATOCRIT: 44.9 % (ref 36.0–46.0)
Hemoglobin: 15.1 g/dL — ABNORMAL HIGH (ref 12.0–15.0)
MCH: 33.3 pg (ref 26.0–34.0)
MCHC: 33.6 g/dL (ref 30.0–36.0)
MCV: 99.1 fL (ref 78.0–100.0)
PLATELETS: 143 10*3/uL — AB (ref 150–400)
RBC: 4.53 MIL/uL (ref 3.87–5.11)
RDW: 12.8 % (ref 11.5–15.5)
WBC: 7.5 10*3/uL (ref 4.0–10.5)

## 2015-12-03 LAB — RPR: RPR Ser Ql: NONREACTIVE

## 2015-12-03 LAB — HEMOGLOBIN A1C
HEMOGLOBIN A1C: 5.4 % (ref 4.8–5.6)
Mean Plasma Glucose: 108 mg/dL

## 2015-12-03 MED ORDER — POTASSIUM CHLORIDE CRYS ER 20 MEQ PO TBCR
40.0000 meq | EXTENDED_RELEASE_TABLET | Freq: Once | ORAL | Status: AC
  Administered 2015-12-03: 40 meq via ORAL
  Filled 2015-12-03: qty 2

## 2015-12-03 MED ORDER — CIPROFLOXACIN HCL 500 MG PO TABS
500.0000 mg | ORAL_TABLET | Freq: Two times a day (BID) | ORAL | Status: AC
  Administered 2015-12-03 – 2015-12-06 (×6): 500 mg via ORAL
  Filled 2015-12-03 (×6): qty 1

## 2015-12-03 NOTE — Progress Notes (Signed)
PROGRESS NOTE  Sydney Arroyo [id number] DOB: 1931-06-16 DOA: 12/01/2015 PCP: Hollace Kinnier, DO    HPI/Subjective: Opens eyes, smiles,, says a few words only  Assessment/Plan:   Acute encephalopathy  - Dr. Silverio Decamp of neuro consulting and much appreciated  - Will stop gabapentin, Seroquel, and Depakote  - Monitor overnight with frequent neuro checks  -TSH, B12, ammonia ok - CTA: P. comm aneurysm that will need to be followed with repeat CTA in one year-- family will probably elect not to follow - Urine is turbid with many bacteria; no other systemic sign of infxn and belly not tender; may be colonization only, but given the clinical scenario, will treat with Cipro empirically; culture not sent untial after abx  Alzheimer Dementia with behavior disturbance  - Avoiding Seroquel and Depakote as above  - Neuro consultant notes that exam consistent with PD, planning to start amantadine?   Hypertension  - Continue home Norvasc, losartan-HCTZ, metoprolol  - BP well-controlled on this regimen currently, monitoring   Sick sinus syndrome  - Has pacer in place  - Monitoring on tele, has been NSR  UTI Presented to the hospital with turbid urine with many bacteria, treated empirically as UTI. Continue current antibiotics, adjust according to culture results.  Code Status: DNR Family Communication: called barry johnson Disposition Plan: from memory care- PT eval-- from [facility]   Consultants:  neuro  Procedures:       Objective: Filed Vitals:   12/03/15 0320 12/03/15 0632  BP: 136/68 124/72  Pulse: 70 71  Temp: 97.8 F (36.6 C) 97.6 F (36.4 C)  Resp: 18 18    Intake/Output Summary (Last 24 hours) at 12/03/15 1350 Last data filed at 12/02/15 1900  Gross per 24 hour  Intake    250 ml  Output    200 ml  Net     50 ml   Filed Weights   12/01/15 1845 12/01/15 2228  Weight: 75.751 kg (167 lb) 75.9 kg (167 lb 5.3 oz)    Exam:   General:   Opens eyes, says a few words  Cardiovascular: rrr  Respiratory: clear  Abdomen: +BS, soft  Musculoskeletal: no edema   Data Reviewed: Basic Metabolic Panel:  Recent Labs Lab 12/01/15 1815 12/01/15 1818 12/01/15 2338 12/03/15 0644  NA 142 142  --  137  K 4.6 5.0  --  3.7  CL 101 103  --  96*  CO2 30  --   --  30  GLUCOSE 105* 99  --  99  BUN 17 27*  --  13  CREATININE 0.71 0.60  --  0.74  CALCIUM 9.7  --   --  9.3  MG  --   --  1.5*  --   PHOS  --   --  3.2  --    Liver Function Tests:  Recent Labs Lab 12/01/15 1815  AST 55*  ALT 27  ALKPHOS 62  BILITOT 1.1  PROT 6.4*  ALBUMIN 3.2*   No results for input(s): LIPASE, AMYLASE in the last 168 hours.  Recent Labs Lab 12/01/15 2338  AMMONIA 25   CBC:  Recent Labs Lab 12/01/15 1815 12/01/15 1818 12/03/15 0644  WBC 7.3  --  7.5  NEUTROABS 3.7  --   --   HGB 16.5* 16.7* 15.1*  HCT 48.8* 49.0* 44.9  MCV 101.5*  --  99.1  PLT 128*  --  143*   Cardiac Enzymes: No results for input(s): CKTOTAL, CKMB, CKMBINDEX, TROPONINI in  the last 168 hours. BNP (last 3 results) No results for input(s): BNP in the last 8760 hours.  ProBNP (last 3 results) No results for input(s): PROBNP in the last 8760 hours.  CBG:  Recent Labs Lab 12/01/15 1932 12/02/15 0813 12/02/15 1227 12/02/15 1817 12/02/15 2155  GLUCAP 94 102* 129* 110* 109*    Recent Results (from the past 240 hour(s))  MRSA PCR Screening     Status: None   Collection Time: 12/02/15  3:55 AM  Result Value Ref Range Status   MRSA by PCR NEGATIVE NEGATIVE Final    Comment:        The GeneXpert MRSA Assay (FDA approved for NASAL specimens only), is one component of a comprehensive MRSA colonization surveillance program. It is not intended to diagnose MRSA infection nor to guide or monitor treatment for MRSA infections.   Culture, Urine     Status: None (Preliminary result)   Collection Time: 12/02/15  3:01 PM  Result Value Ref Range Status    Specimen Description URINE, CLEAN CATCH  Final   Special Requests NONE  Final   Culture TOO YOUNG TO READ  Final   Report Status PENDING  Incomplete     Studies: Ct Angio Head W/cm &/or Wo Cm  12/01/2015  CLINICAL DATA:  Initial evaluation for possible right facial droop and right-sided weakness. EXAM: CT ANGIOGRAPHY HEAD AND NECK TECHNIQUE: Multidetector CT imaging of the head and neck was performed using the standard protocol during bolus administration of intravenous contrast. Multiplanar CT image reconstructions and MIPs were obtained to evaluate the vascular anatomy. Carotid stenosis measurements (when applicable) are obtained utilizing NASCET criteria, using the distal internal carotid diameter as the denominator. CONTRAST:  135mL OMNIPAQUE IOHEXOL 350 MG/ML SOLN COMPARISON:  Prior head CT from earlier the same day. FINDINGS: CTA NECK Aortic arch: Visualized aortic arch is of normal caliber with normal 3 vessel morphology. Scattered atheromatous plaque present within the arch itself without stenosis. Scattered plaque present within the proximal subclavian arteries without high-grade flow-limiting stenosis. Right carotid system: Origin of right common carotid artery widely patent. The proximal right common carotid artery is tortuous proximally. Right common carotid artery widely patent to its distal aspect. Atheromatous plaque within the distal right common carotid artery prior to the bifurcation results in moderate stenosis (approximately 40-50%. Distally, there is extensive atheromatous plaque about the right carotid bifurcation/proximal right ICA. There is short-segment atheromatous stenosis at the origin of the right ICA avid least 80-90% by NASCET criteria (series 502, image 215). Distally, right ICA widely patent to the skullbase. Left carotid system: Left common carotid artery patent from its origin to the bifurcation. Scattered calcified plaque about the left carotid bifurcation/proximal left  ICA with associated short segment stenosis of approximately 50% by NASCET criteria. Distally, left ICA is tortuous but widely patent to the skullbase. Vertebral arteries:Both vertebral arteries arise from the subclavian arteries. Origin of the vertebral arteries widely patent. Pre foraminal V1 segments are tortuous. Vertebral arteries patent along their entire course without focal stenosis, dissection, or vascular occlusion. Skeleton: Reversal of the normal cervical lordosis. Trace anterolisthesis of C2 on C3. Advanced degenerative spondylolysis at C4-5 and C6-7. No acute osseous abnormality. Other neck: Visualized lungs demonstrate no acute process. Partially visualized superior mediastinum normal. Subcentimeter hypodense nodule within the right lobe of thyroid noted, of doubtful clinical significance. No adenopathy. No acute soft tissue abnormality within the neck. CTA HEAD Anterior circulation: Petrous segments widely patent. Smooth atheromatous plaque throughout the carotid siphons  with resultant mild diffuse narrowing. Plaque extends into the supraclinoid segments without significant stenosis. Left A1 segment slightly hypoplastic but widely patent. Right A1 segment patent. Anterior communicating artery normal. Anterior cerebral arteries opacified to their distal aspects. M1 segments widely patent without stenosis or occlusion. MCA bifurcations normal. No proximal M2 branch occlusion. Short short-segment moderate stenosis within a proximal left M2 branch, seen best on MIP reconstructions (series 507, image 20). MCA branches well opacified distally and symmetric. Posterior circulation: Vertebral arteries patent to the vertebrobasilar junction. Posterior inferior cerebral arteries patent. Basilar artery tortuous but widely patent. Superior cerebellar arteries well opacified bilaterally. Both posterior cerebral arteries arise from the basilar artery. PCAs resting the basilar artery and are well opacified to their  distal aspects. Mild distal small vessel changes within the distal PCAs. Venous sinuses: Patent without venous sinus thrombosis. Anatomic variants: No anatomic variant. 3 x 3 x 4 mm focal outpouching arising from the supraclinoid right ICA consistent with a small posterior communicating artery aneurysm (series 504, image 94). Delayed phase: No abnormal enhancement on delayed sequence. CT PERFUSION: No perfusion mismatch to suggest acute ischemia identified. Subtle elevated mean transit time and time to peak within the anterior left MCA distribution, like related to the previously identified proximal left M2 branch stenosis. No other perfusion abnormality. IMPRESSION: CTA NECK IMPRESSION: 1. Atheromatous plaque at the right carotid bifurcation/proximal right ICA with associated short segment stenosis of 80-90% by NASCET criteria at the origin of the right ICA. 2. Atheromatous stenosis of approximately 50% at the origin of the left ICA. 3. Widely patent vertebral arteries. CTA HEAD IMPRESSION: 1. No large or proximal arterial branch occlusion. 2. Short-segment moderate proximal left M2 stenosis as above. 3. 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 CT PERFUSION: 1. No perfusion mismatch to suggest acute ischemia. 2. 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 Critical Value/emergent results were called by telephone at the time of interpretation on 12/01/2015 at 10:49 pm to Dr. Armida Sans , who verbally acknowledged these results. Electronically Signed   By: Jeannine Boga M.D.   On: 12/01/2015 23:05   Ct Head Wo Contrast  12/01/2015  CLINICAL DATA:  Code stroke. Right-sided weakness and slurred speech. EXAM: CT HEAD WITHOUT CONTRAST TECHNIQUE: Contiguous axial images were obtained from the base of the skull through the vertex without intravenous contrast. COMPARISON:  None. FINDINGS: Generalized atrophy and chronic small vessel  ischemia. Slightly more confluent ill-defined hypodensity and obscuration of gray-white differentiation in the periventricular left frontal lobe. No intracranial hemorrhage. No intracranial or extra-axial fluid collection. No hydrocephalus. Basal cisterns are patent. The calvarium is intact. Included paranasal sinuses and mastoid air cells are well aerated. IMPRESSION: 1. Subtle ill-defined hypodensity within the left periventricular white matter, concerning for acute ischemia. There are no prior exams available for comparison. 2. Background atrophy and chronic small vessel ischemic change. These results were called by telephone at the time of interpretation on 12/01/2015 at 6:30 pm to Dr. Silverio Decamp , who verbally acknowledged these results. Electronically Signed   By: Jeb Levering M.D.   On: 12/01/2015 18:32   Ct Angio Neck W/cm &/or Wo/cm  12/01/2015  CLINICAL DATA:  Initial evaluation for possible right facial droop and right-sided weakness. EXAM: CT ANGIOGRAPHY HEAD AND NECK TECHNIQUE: Multidetector CT imaging of the head and neck was performed using the standard protocol during bolus administration of intravenous contrast. Multiplanar CT  image reconstructions and MIPs were obtained to evaluate the vascular anatomy. Carotid stenosis measurements (when applicable) are obtained utilizing NASCET criteria, using the distal internal carotid diameter as the denominator. CONTRAST:  131mL OMNIPAQUE IOHEXOL 350 MG/ML SOLN COMPARISON:  Prior head CT from earlier the same day. FINDINGS: CTA NECK Aortic arch: Visualized aortic arch is of normal caliber with normal 3 vessel morphology. Scattered atheromatous plaque present within the arch itself without stenosis. Scattered plaque present within the proximal subclavian arteries without high-grade flow-limiting stenosis. Right carotid system: Origin of right common carotid artery widely patent. The proximal right common carotid artery is tortuous proximally. Right common  carotid artery widely patent to its distal aspect. Atheromatous plaque within the distal right common carotid artery prior to the bifurcation results in moderate stenosis (approximately 40-50%. Distally, there is extensive atheromatous plaque about the right carotid bifurcation/proximal right ICA. There is short-segment atheromatous stenosis at the origin of the right ICA avid least 80-90% by NASCET criteria (series 502, image 215). Distally, right ICA widely patent to the skullbase. Left carotid system: Left common carotid artery patent from its origin to the bifurcation. Scattered calcified plaque about the left carotid bifurcation/proximal left ICA with associated short segment stenosis of approximately 50% by NASCET criteria. Distally, left ICA is tortuous but widely patent to the skullbase. Vertebral arteries:Both vertebral arteries arise from the subclavian arteries. Origin of the vertebral arteries widely patent. Pre foraminal V1 segments are tortuous. Vertebral arteries patent along their entire course without focal stenosis, dissection, or vascular occlusion. Skeleton: Reversal of the normal cervical lordosis. Trace anterolisthesis of C2 on C3. Advanced degenerative spondylolysis at C4-5 and C6-7. No acute osseous abnormality. Other neck: Visualized lungs demonstrate no acute process. Partially visualized superior mediastinum normal. Subcentimeter hypodense nodule within the right lobe of thyroid noted, of doubtful clinical significance. No adenopathy. No acute soft tissue abnormality within the neck. CTA HEAD Anterior circulation: Petrous segments widely patent. Smooth atheromatous plaque throughout the carotid siphons with resultant mild diffuse narrowing. Plaque extends into the supraclinoid segments without significant stenosis. Left A1 segment slightly hypoplastic but widely patent. Right A1 segment patent. Anterior communicating artery normal. Anterior cerebral arteries opacified to their distal  aspects. M1 segments widely patent without stenosis or occlusion. MCA bifurcations normal. No proximal M2 branch occlusion. Short short-segment moderate stenosis within a proximal left M2 branch, seen best on MIP reconstructions (series 507, image 20). MCA branches well opacified distally and symmetric. Posterior circulation: Vertebral arteries patent to the vertebrobasilar junction. Posterior inferior cerebral arteries patent. Basilar artery tortuous but widely patent. Superior cerebellar arteries well opacified bilaterally. Both posterior cerebral arteries arise from the basilar artery. PCAs resting the basilar artery and are well opacified to their distal aspects. Mild distal small vessel changes within the distal PCAs. Venous sinuses: Patent without venous sinus thrombosis. Anatomic variants: No anatomic variant. 3 x 3 x 4 mm focal outpouching arising from the supraclinoid right ICA consistent with a small posterior communicating artery aneurysm (series 504, image 94). Delayed phase: No abnormal enhancement on delayed sequence. CT PERFUSION: No perfusion mismatch to suggest acute ischemia identified. Subtle elevated mean transit time and time to peak within the anterior left MCA distribution, like related to the previously identified proximal left M2 branch stenosis. No other perfusion abnormality. IMPRESSION: CTA NECK IMPRESSION: 1. Atheromatous plaque at the right carotid bifurcation/proximal right ICA with associated short segment stenosis of 80-90% by NASCET criteria at the origin of the right ICA. 2. Atheromatous stenosis of approximately 50% at  the origin of the left ICA. 3. Widely patent vertebral arteries. CTA HEAD IMPRESSION: 1. No large or proximal arterial branch occlusion. 2. Short-segment moderate proximal left M2 stenosis as above. 3. 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 CT PERFUSION: 1. No perfusion mismatch to suggest acute ischemia. 2. Subtly increased mean transit time and time to peak within the  left MCA distribution, likely related to the proximal left M2 stenosis. Critical Value/emergent results were called by telephone at the time of interpretation on 12/01/2015 at 10:49 pm to Dr. Armida Sans , who verbally acknowledged these results. Electronically Signed   By: Jeannine Boga M.D.   On: 12/01/2015 23:05   Ct Cerebral Perfusion W/cm  12/01/2015  CLINICAL DATA:  Initial evaluation for possible right facial droop and right-sided weakness. EXAM: CT ANGIOGRAPHY HEAD AND NECK TECHNIQUE: Multidetector CT imaging of the head and neck was performed using the standard protocol during bolus administration of intravenous contrast. Multiplanar CT image reconstructions and MIPs were obtained to evaluate the vascular anatomy. Carotid stenosis measurements (when applicable) are obtained utilizing NASCET criteria, using the distal internal carotid diameter as the denominator. CONTRAST:  114mL OMNIPAQUE IOHEXOL 350 MG/ML SOLN COMPARISON:  Prior head CT from earlier the same day. FINDINGS: CTA NECK Aortic arch: Visualized aortic arch is of normal caliber with normal 3 vessel morphology. Scattered atheromatous plaque present within the arch itself without stenosis. Scattered plaque present within the proximal subclavian arteries without high-grade flow-limiting stenosis. Right carotid system: Origin of right common carotid artery widely patent. The proximal right common carotid artery is tortuous proximally. Right common carotid artery widely patent to its distal aspect. Atheromatous plaque within the distal right common carotid artery prior to the bifurcation results in moderate stenosis (approximately 40-50%. Distally, there is extensive atheromatous plaque about the right carotid bifurcation/proximal right ICA. There is short-segment atheromatous stenosis at the origin of the right ICA avid least 80-90% by NASCET criteria (series 502, image 215). Distally, right ICA widely patent to the skullbase. Left carotid system:  Left common carotid artery patent from its origin to the bifurcation. Scattered calcified plaque about the left carotid bifurcation/proximal left ICA with associated short segment stenosis of approximately 50% by NASCET criteria. Distally, left ICA is tortuous but widely patent to the skullbase. Vertebral arteries:Both vertebral arteries arise from the subclavian arteries. Origin of the vertebral arteries widely patent. Pre foraminal V1 segments are tortuous. Vertebral arteries patent along their entire course without focal stenosis, dissection, or vascular occlusion. Skeleton: Reversal of the normal cervical lordosis. Trace anterolisthesis of C2 on C3. Advanced degenerative spondylolysis at C4-5 and C6-7. No acute osseous abnormality. Other neck: Visualized lungs demonstrate no acute process. Partially visualized superior mediastinum normal. Subcentimeter hypodense nodule within the right lobe of thyroid noted, of doubtful clinical significance. No adenopathy. No acute soft tissue abnormality within the neck. CTA HEAD Anterior circulation: Petrous segments widely patent. Smooth atheromatous plaque throughout the carotid siphons with resultant mild diffuse narrowing. Plaque extends into the supraclinoid segments without significant stenosis. Left A1 segment slightly hypoplastic but widely patent. Right A1 segment patent. Anterior communicating artery normal. Anterior cerebral arteries opacified to their distal aspects. M1 segments widely patent without stenosis or occlusion. MCA bifurcations normal. No proximal M2 branch occlusion. Short short-segment moderate stenosis within a proximal left M2 branch, seen best on MIP reconstructions (series 507, image 20). MCA branches well opacified distally and symmetric. Posterior circulation: Vertebral arteries patent to the vertebrobasilar junction. Posterior inferior cerebral arteries patent. Basilar  artery tortuous but widely patent. Superior cerebellar arteries well  opacified bilaterally. Both posterior cerebral arteries arise from the basilar artery. PCAs resting the basilar artery and are well opacified to their distal aspects. Mild distal small vessel changes within the distal PCAs. Venous sinuses: Patent without venous sinus thrombosis. Anatomic variants: No anatomic variant. 3 x 3 x 4 mm focal outpouching arising from the supraclinoid right ICA consistent with a small posterior communicating artery aneurysm (series 504, image 94). Delayed phase: No abnormal enhancement on delayed sequence. CT PERFUSION: No perfusion mismatch to suggest acute ischemia identified. Subtle elevated mean transit time and time to peak within the anterior left MCA distribution, like related to the previously identified proximal left M2 branch stenosis. No other perfusion abnormality. IMPRESSION: CTA NECK IMPRESSION: 1. Atheromatous plaque at the right carotid bifurcation/proximal right ICA with associated short segment stenosis of 80-90% by NASCET criteria at the origin of the right ICA. 2. Atheromatous stenosis of approximately 50% at the origin of the left ICA. 3. Widely patent vertebral arteries. CTA HEAD IMPRESSION: 1. No large or proximal arterial branch occlusion. 2. Short-segment moderate proximal left M2 stenosis as above. 3. 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 CT PERFUSION: 1. No perfusion mismatch to suggest acute ischemia. 2. 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 Critical Value/emergent results were called by telephone at the time of interpretation on 12/01/2015 at 10:49 pm to Dr. Armida Sans , who verbally acknowledged these results. Electronically Signed   By: Jeannine Boga M.D.   On: 12/01/2015 23:05    Scheduled Meds: . amLODipine  5 mg Oral Daily  . cholecalciferol  1,000 Units Oral Daily  . ciprofloxacin  500 mg Oral BID  . clopidogrel  75 mg Oral Daily  . colesevelam  625 mg  Oral Daily  . donepezil  10 mg Oral Daily  . DULoxetine  30 mg Oral Daily  . enoxaparin (LOVENOX) injection  40 mg Subcutaneous Q24H  . hydrochlorothiazide  25 mg Oral Daily  . losartan  100 mg Oral Daily  . memantine  28 mg Oral Daily  . metoprolol succinate  25 mg Oral Daily  . montelukast  10 mg Oral QHS   Continuous Infusions:  Antibiotics Given (last 72 hours)    Date/Time Action Medication Dose Rate   12/02/15 0417 Given   ciprofloxacin (CIPRO) IVPB 400 mg 400 mg 200 mL/hr   12/02/15 1515 Given   ciprofloxacin (CIPRO) IVPB 400 mg 400 mg 200 mL/hr   12/03/15 0248 Given   ciprofloxacin (CIPRO) IVPB 400 mg 400 mg 200 mL/hr      Principal Problem:   Encephalopathy acute Active Problems:   Hypertension   Sick sinus syndrome (HCC)   Atrial fibrillation, new onset ([place])   Alzheimer's dementia with behavioral disturbance   Acute encephalopathy    Time spent: 25 min    [facility] Pager [phone]. If 7PM-7AM, please contact night-coverage at [email or web address], password [facility] 12/03/2015, 1:50 PM  LOS: 1 day

## 2015-12-03 NOTE — Progress Notes (Signed)
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 the basilar artery and are well opacified to their distal aspects. Mild distal small vessel changes within the distal PCAs. Venous sinuses: Patent without venous sinus thrombosis. Anatomic variants: No anatomic variant. 3 x 3 x 4 mm focal outpouching arising from the supraclinoid right ICA consistent with a small posterior communicating artery aneurysm (series 504, image 94). Delayed phase: No abnormal enhancement on delayed  sequence. CT PERFUSION: No perfusion mismatch to suggest acute ischemia identified. Subtle elevated mean transit time and time to peak within the anterior left MCA distribution, like related to the previously identified proximal left M2 branch stenosis. No other perfusion abnormality. IMPRESSION: CTA NECK IMPRESSION: 1. Atheromatous plaque at the right carotid bifurcation/proximal right ICA with associated short segment stenosis of 80-90% by NASCET criteria at the origin of the right ICA. 2. Atheromatous stenosis of approximately 50% at the origin of the left ICA. 3. Widely patent vertebral arteries. CTA HEAD IMPRESSION: 1. No large or proximal arterial branch occlusion. 2. Short-segment moderate  proximal left M2 stenosis as above. 3. 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 CT PERFUSION: 1. No perfusion mismatch to suggest acute ischemia. 2. 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 Critical Value/emergent results were called by telephone at the time of interpretation on 12/01/2015 at 10:49 pm to Dr. Armida Sans , who verbally acknowledged these results. Electronically Signed   By: Jeannine Boga M.D.   On: 12/01/2015 23:05     Assessment and plan:   DERRICKA FROESE is an 82 y.o. female patient with dementia and Parkinson's disease, improved level of alertness after discontinuing some of the sedating medications. She continues to be confused, not oriented to place or time suggestive of likely advanced dementia. Will discontinue Aricept as well,to reduce the risk of psychiatric side effects. Consider switching ciprofloxacin to a non-fluoroquinolone antibiotic due to its known association with encephalopathy in elderly patients.  We'll follow-up.

## 2015-12-03 NOTE — NC FL2 (Signed)
[facility] LEVEL OF CARE SCREENING TOOL     IDENTIFICATION  Patient Name: Sydney Arroyo Birthdate: 1931/07/31 Sex: female Admission Date (Current Location): 12/01/2015  [facility] and [place] Number:  Herbalist and Address:  [facility]. [facility], [place] [address], [place], [place] [address]      Provider Number: [id number]  Attending Physician Name and Address:  Verlee Monte, MD  Relative Name and Phone Number:       Current Level of Care: Hospital Recommended Level of Care: [place] Prior Approval Number:    Date Approved/Denied:   PASRR Number:    Discharge Plan: Other (Comment) (Memory Care Unit)    Current Diagnoses: Patient Active Problem List   Diagnosis Date Noted  . Encephalopathy acute 12/01/2015  . Acute encephalopathy 12/01/2015  . HLD (hyperlipidemia) 09/12/2015  . Alzheimer's dementia with behavioral disturbance 05/24/2015  . Sick sinus syndrome ([place]) 03/02/2015  . Atrial fibrillation, new onset ([place]) 03/02/2015  . Depression 03/17/2014  . Hearing loss 02/24/2014  . Advanced care planning/counseling discussion 02/24/2014  . History of breast cancer 09/16/2013  . Osteoarthritis of hand 06/22/2013  . Vertigo 04/09/2013  . Hypertension   . Spinal stenosis of lumbar region 04/08/2013  . Bradycardia 01/18/2013  . Pure hypercholesterolemia 09/03/2012    Orientation RESPIRATION BLADDER Height & Weight    Self  Normal Incontinent 5\' 4"  (162.6 cm) 167 lbs.  BEHAVIORAL SYMPTOMS/MOOD NEUROLOGICAL BOWEL NUTRITION STATUS      Continent Diet (Heart Healthy)  AMBULATORY STATUS COMMUNICATION OF NEEDS Skin   Limited Assist Verbally Normal                       Personal Care Assistance Level of Assistance  Bathing, Feeding, Dressing Bathing Assistance: Limited assistance Feeding assistance: Limited assistance Dressing Assistance: Limited assistance     Functional Limitations Info  Sight, Hearing,  Speech Sight Info: Adequate Hearing Info: Adequate Speech Info: Adequate    SPECIAL CARE FACTORS FREQUENCY  PT (By licensed PT), OT (By licensed OT)     PT Frequency: 3 OT Frequency: 2            Contractures Contractures Info: Not present    Additional Factors Info  Code Status, Allergies, Psychotropic Code Status Info: DNR Allergies Info: Demerol, Ketorolac, Percocet, Toradol Psychotropic Info: aricept, cymbalta         Current Medications (12/03/2015):  This is the current hospital active medication list Current Facility-Administered Medications  Medication Dose Route Frequency Provider Last Rate Last Dose  . acetaminophen (TYLENOL) tablet 650 mg  650 mg Oral Q4H PRN Vianne Bulls, MD       Or  . acetaminophen (TYLENOL) suppository 650 mg  650 mg Rectal Q4H PRN Ilene Qua Opyd, MD      . amLODipine (NORVASC) tablet 5 mg  5 mg Oral Daily Vianne Bulls, MD   5 mg at 12/03/15 1027  . cholecalciferol (VITAMIN D) tablet 1,000 Units  1,000 Units Oral Daily Vianne Bulls, MD   1,000 Units at 12/03/15 1027  . ciprofloxacin (CIPRO) tablet 500 mg  500 mg Oral BID Lauren D Bajbus, RPH      . clopidogrel (PLAVIX) tablet 75 mg  75 mg Oral Daily Ram Fuller Mandril, MD   75 mg at 12/03/15 1028  . colesevelam [facility]) tablet 625 mg  625 mg Oral Daily Vianne Bulls, MD   625 mg at 12/03/15 1028  .  donepezil (ARICEPT) tablet 10 mg  10 mg Oral Daily Vianne Bulls, MD   10 mg at 12/03/15 1028  . DULoxetine (CYMBALTA) DR capsule 30 mg  30 mg Oral Daily Vianne Bulls, MD   30 mg at 12/03/15 1028  . enoxaparin (LOVENOX) injection 40 mg  40 mg Subcutaneous Q24H Vianne Bulls, MD   40 mg at 12/03/15 1028  . hydrochlorothiazide (HYDRODIURIL) tablet 25 mg  25 mg Oral Daily Vianne Bulls, MD   25 mg at 12/03/15 1028  . LORazepam (ATIVAN) tablet 0.25 mg  0.25 mg Oral Q6H PRN Vianne Bulls, MD      . losartan (COZAAR) tablet 100 mg  100 mg Oral Daily Vianne Bulls, MD   100 mg at  12/03/15 1028  . memantine (NAMENDA XR) 24 hr capsule 28 mg  28 mg Oral Daily Vianne Bulls, MD   28 mg at 12/03/15 1028  . metoprolol succinate (TOPROL-XL) 24 hr tablet 25 mg  25 mg Oral Daily Vianne Bulls, MD   25 mg at 12/03/15 1028  . montelukast (SINGULAIR) tablet 10 mg  10 mg Oral QHS Vianne Bulls, MD   10 mg at 12/02/15 2156  . senna-docusate (Senokot-S) tablet 1 tablet  1 tablet Oral QHS PRN Vianne Bulls, MD         Discharge Medications: Please see discharge summary for a list of discharge medications.  Relevant Imaging Results:  Relevant Lab Results:   Additional [place], [place]

## 2015-12-04 DIAGNOSIS — G308 Other Alzheimer's disease: Secondary | ICD-10-CM

## 2015-12-04 DIAGNOSIS — R2990 Unspecified symptoms and signs involving the nervous system: Secondary | ICD-10-CM | POA: Diagnosis present

## 2015-12-04 DIAGNOSIS — N179 Acute kidney failure, unspecified: Secondary | ICD-10-CM

## 2015-12-04 DIAGNOSIS — R4182 Altered mental status, unspecified: Secondary | ICD-10-CM | POA: Diagnosis present

## 2015-12-04 LAB — CK: Total CK: 22 U/L — ABNORMAL LOW (ref 38–234)

## 2015-12-04 LAB — BASIC METABOLIC PANEL
ANION GAP: 10 (ref 5–15)
BUN: 27 mg/dL — ABNORMAL HIGH (ref 6–20)
CHLORIDE: 97 mmol/L — AB (ref 101–111)
CO2: 31 mmol/L (ref 22–32)
Calcium: 9.3 mg/dL (ref 8.9–10.3)
Creatinine, Ser: 1.81 mg/dL — ABNORMAL HIGH (ref 0.44–1.00)
GFR calc Af Amer: 29 mL/min — ABNORMAL LOW (ref >60)
GFR, EST NON AFRICAN AMERICAN: 25 mL/min — AB (ref >60)
Glucose, Bld: 105 mg/dL — ABNORMAL HIGH (ref 65–99)
POTASSIUM: 4 mmol/L (ref 3.5–5.1)
SODIUM: 138 mmol/L (ref 135–145)

## 2015-12-04 LAB — GLUCOSE, CAPILLARY
GLUCOSE-CAPILLARY: 101 mg/dL — AB (ref 65–99)
GLUCOSE-CAPILLARY: 99 mg/dL (ref 65–99)

## 2015-12-04 LAB — URINE CULTURE

## 2015-12-04 LAB — MAGNESIUM: MAGNESIUM: 1.9 mg/dL (ref 1.7–2.4)

## 2015-12-04 MED ORDER — ENOXAPARIN SODIUM 30 MG/0.3ML ~~LOC~~ SOLN
30.0000 mg | SUBCUTANEOUS | Status: DC
  Administered 2015-12-05: 30 mg via SUBCUTANEOUS
  Filled 2015-12-04: qty 0.3

## 2015-12-04 MED ORDER — SODIUM CHLORIDE 0.9 % IV SOLN
INTRAVENOUS | Status: DC
Start: 2015-12-04 — End: 2015-12-06
  Administered 2015-12-04 (×2): via INTRAVENOUS
  Administered 2015-12-05: 1000 mL via INTRAVENOUS
  Administered 2015-12-05: 07:00:00 via INTRAVENOUS

## 2015-12-04 NOTE — Progress Notes (Signed)
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 Superior cerebellar arteries well opacified bilaterally. Both posterior cerebral arteries arise from the basilar artery. PCAs resting the basilar artery and are well opacified to their distal aspects. Mild distal small vessel changes within the distal PCAs. Venous sinuses: Patent without venous sinus thrombosis. Anatomic variants: No anatomic variant. 3 x 3 x 4 mm focal outpouching arising from the supraclinoid right ICA  consistent with a small posterior communicating artery aneurysm (series 504, image 94). Delayed phase: No abnormal enhancement on delayed sequence. CT PERFUSION: No perfusion mismatch to suggest acute ischemia identified. Subtle elevated mean transit time and time to peak within the anterior left MCA distribution, like related to the previously identified proximal left M2 branch stenosis. No other perfusion abnormality. IMPRESSION: CTA NECK IMPRESSION: 1. Atheromatous plaque at the right carotid bifurcation/proximal right ICA with associated short segment stenosis of 80-90% by NASCET criteria at the origin of the right ICA. 2. Atheromatous stenosis of approximately 50% at the origin of the left ICA. 3. Widely  patent vertebral arteries. CTA HEAD IMPRESSION: 1. No large or proximal arterial branch occlusion. 2. Short-segment moderate proximal left M2 stenosis as above. 3. 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 CT PERFUSION: 1. No perfusion mismatch to suggest acute ischemia. 2. 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 Critical Value/emergent results were called by telephone at the time of interpretation on 12/01/2015 at 10:49 pm to Dr. Armida Sans , who verbally acknowledged these results. Electronically Signed   By: Jeannine Boga M.D.   On: 12/01/2015 23:05          Assessment and plan:   Sydney Arroyo is an 82 y.o. female patient with advanced dementia, not oriented to time or place, Parkinson's disease with significant hypophonia and limb rigidity with mild intermittent resting tremors. She is admitted with increased sedation believed to be secondary to polypharmacy of several neuropsychiatric and pain medicines she is more awake now after several of these medications have been discontinued, continues to be disoriented which is likely her baseline from dementia.  Morning labs today showed highly elevated creatinine of 1.8. Since admission until yesterday , it was normal at 0.7 .   electrolytes remained stable .  We'll defer to hospitalist service for further evaluation of this possible acute renal failure. Ordered a repeat creatinine and CK labs.   For Parkinson's disease, she will benefit from starting amantadine 100 mg in the morning to help with the Parkinson's extrapyramidal motor symptoms. But given this new acute renal failure noted this morning, we'll hold off on starting this medication until the renal function is corrected back to baseline.  Please call for any further questions.

## 2015-12-04 NOTE — Progress Notes (Signed)
PROGRESS NOTE  Sydney Arroyo [id number] DOB: 30-Jul-1931 DOA: 12/01/2015 PCP: Hollace Kinnier, DO    HPI/Subjective: Opens eyes, smiles,, says a few words only  Assessment/Plan:   Acute encephalopathy  - Dr. Silverio Decamp of neuro consulting and much appreciated  - Will stop gabapentin, Seroquel, and Depakote  - Monitor overnight with frequent neuro checks  -TSH, B12, ammonia ok - CTA: P. comm aneurysm that will need to be followed with repeat CTA in one year-- family will probably elect not to follow - Urine is turbid with many bacteria; no other systemic sign of infxn and belly not tender; may be colonization only, but given the clinical scenario, will treat with Cipro empirically; culture not sent untial after abx  Acute renal failure Creatinine increased from 0.74 up to 1.81, also BUN is high. Held nephrotoxic medication including HCTZ and Cozaar We'll treat empirically for dehydration with IV fluids given her poor oral intake. Check BMP in a.m.  Alzheimer Dementia with behavior disturbance  - Avoiding Seroquel and Depakote as above  - Neuro consultant notes that exam consistent with PD, planning to start amantadine?   Hypertension  - Continue home Norvasc, losartan-HCTZ, metoprolol  - BP well-controlled on this regimen currently, monitoring   Sick sinus syndrome  - Has pacer in place  - Monitoring on tele, has been NSR  UTI Presented to the hospital with turbid urine with many bacteria, treated empirically as UTI. Continue current antibiotics, adjust according to culture results.   Code Status: DNR Family Communication: called barry johnson Disposition Plan: from memory care- PT eval-- from [place]   Consultants:  neuro  Procedures:       Objective: Filed Vitals:   12/03/15 2236 12/04/15 0526  BP: 128/64 113/57  Pulse: 96 75  Temp: 99.4 F (37.4 C) 98.8 F (37.1 C)  Resp: 15 18   No intake or output data in the 24 hours ending  12/04/15 1244 Filed Weights   12/01/15 1845 12/01/15 2228  Weight: 75.751 kg (167 lb) 75.9 kg (167 lb 5.3 oz)    Exam:   General:  Opens eyes, says a few words  Cardiovascular: rrr  Respiratory: clear  Abdomen: +BS, soft  Musculoskeletal: no edema   Data Reviewed: Basic Metabolic Panel:  Recent Labs Lab 12/01/15 1815 12/01/15 1818 12/01/15 2338 12/03/15 0644 12/04/15 0600  NA 142 142  --  137 138  K 4.6 5.0  --  3.7 4.0  CL 101 103  --  96* 97*  CO2 30  --   --  30 31  GLUCOSE 105* 99  --  99 105*  BUN 17 27*  --  13 27*  CREATININE 0.71 0.60  --  0.74 1.81*  CALCIUM 9.7  --   --  9.3 9.3  MG  --   --  1.5*  --  1.9  PHOS  --   --  3.2  --   --    Liver Function Tests:  Recent Labs Lab 12/01/15 1815  AST 55*  ALT 27  ALKPHOS 62  BILITOT 1.1  PROT 6.4*  ALBUMIN 3.2*   No results for input(s): LIPASE, AMYLASE in the last 168 hours.  Recent Labs Lab 12/01/15 2338  AMMONIA 25   CBC:  Recent Labs Lab 12/01/15 1815 12/01/15 1818 12/03/15 0644  WBC 7.3  --  7.5  NEUTROABS 3.7  --   --   HGB 16.5* 16.7* 15.1*  HCT 48.8* 49.0* 44.9  MCV 101.5*  --  99.1  PLT 128*  --  143*   Cardiac Enzymes:  Recent Labs Lab 12/04/15 0823  CKTOTAL 22*   BNP (last 3 results) No results for input(s): BNP in the last 8760 hours.  ProBNP (last 3 results) No results for input(s): PROBNP in the last 8760 hours.  CBG:  Recent Labs Lab 12/02/15 1227 12/02/15 1817 12/02/15 2155 12/04/15 0221 12/04/15 0412  GLUCAP 129* 110* 109* 101* 99    Recent Results (from the past 240 hour(s))  MRSA PCR Screening     Status: None   Collection Time: 12/02/15  3:55 AM  Result Value Ref Range Status   MRSA by PCR NEGATIVE NEGATIVE Final    Comment:        The GeneXpert MRSA Assay (FDA approved for NASAL specimens only), is one component of a comprehensive MRSA colonization surveillance program. It is not intended to diagnose MRSA infection nor to guide  or monitor treatment for MRSA infections.   Culture, Urine     Status: None (Preliminary result)   Collection Time: 12/02/15  3:01 PM  Result Value Ref Range Status   Specimen Description URINE, CLEAN CATCH  Final   Special Requests NONE  Final   Culture TOO YOUNG TO READ  Final   Report Status PENDING  Incomplete     Studies: No results found.  Scheduled Meds: . amLODipine  5 mg Oral Daily  . cholecalciferol  1,000 Units Oral Daily  . ciprofloxacin  500 mg Oral BID  . clopidogrel  75 mg Oral Daily  . colesevelam  625 mg Oral Daily  . DULoxetine  30 mg Oral Daily  . enoxaparin (LOVENOX) injection  40 mg Subcutaneous Q24H  . hydrochlorothiazide  25 mg Oral Daily  . losartan  100 mg Oral Daily  . memantine  28 mg Oral Daily  . metoprolol succinate  25 mg Oral Daily  . montelukast  10 mg Oral QHS   Continuous Infusions: . sodium chloride 100 mL/hr at 12/04/15 [id number]   Antibiotics Given (last 72 hours)    Date/Time Action Medication Dose Rate   12/02/15 0417 Given   ciprofloxacin (CIPRO) IVPB 400 mg 400 mg 200 mL/hr   12/02/15 1515 Given   ciprofloxacin (CIPRO) IVPB 400 mg 400 mg 200 mL/hr   12/03/15 0248 Given   ciprofloxacin (CIPRO) IVPB 400 mg 400 mg 200 mL/hr   12/03/15 1950 Given   ciprofloxacin (CIPRO) tablet 500 mg 500 mg    12/04/15 [id number] Given   ciprofloxacin (CIPRO) tablet 500 mg 500 mg       Principal Problem:   Encephalopathy acute Active Problems:   Hypertension   Sick sinus syndrome (HCC)   Atrial fibrillation, new onset ([place])   Alzheimer's dementia with behavioral disturbance   Acute encephalopathy    Time spent: 25 min    [place] [facility] Pager [phone]. If 7PM-7AM, please contact night-coverage at [email or web address], password [facility] 12/04/2015, 12:44 PM  LOS: 2 days

## 2015-12-05 LAB — BASIC METABOLIC PANEL
Anion gap: 12 (ref 5–15)
BUN: 28 mg/dL — AB (ref 6–20)
CHLORIDE: 108 mmol/L (ref 101–111)
CO2: 23 mmol/L (ref 22–32)
CREATININE: 1.11 mg/dL — AB (ref 0.44–1.00)
Calcium: 9 mg/dL (ref 8.9–10.3)
GFR calc Af Amer: 52 mL/min — ABNORMAL LOW (ref >60)
GFR calc non Af Amer: 45 mL/min — ABNORMAL LOW (ref >60)
GLUCOSE: 117 mg/dL — AB (ref 65–99)
Potassium: 3.8 mmol/L (ref 3.5–5.1)
Sodium: 143 mmol/L (ref 135–145)

## 2015-12-05 MED ORDER — ENOXAPARIN SODIUM 40 MG/0.4ML ~~LOC~~ SOLN
40.0000 mg | SUBCUTANEOUS | Status: DC
  Administered 2015-12-06: 40 mg via SUBCUTANEOUS
  Filled 2015-12-05: qty 0.4

## 2015-12-05 NOTE — Progress Notes (Signed)
Physical Therapy Treatment Patient Details Name: Sydney Arroyo MRN: [id number] DOB: 03/09/1931 Today's Date: 12/29/2015    History of Present Illness Pt adm with incr lethargy and confusion. Pt found to have UTI. Neurology work up and pt not felt to have CVA. PMH - alzheimers dementia, breast CA, vertigo, pacer    PT Comments    Pt lethargic this AM and unable to mobilize. Will reattempt at a later time when hopefully she will be more alert.  Follow Up Recommendations  SNF (if pt doesn't become more alert and mobile)     Equipment Recommendations  None recommended by PT    Recommendations for Other Services       Precautions / Restrictions Precautions Precautions: Fall Restrictions Weight Bearing Restrictions: No    Mobility  Bed Mobility Overal bed mobility: Needs Assistance Bed Mobility: Supine to Sit;Sit to Supine     Supine to sit: Total assist Sit to supine: Total assist   General bed mobility comments: Pt didn't assist due to lethargy.  Transfers                    Ambulation/Gait                 Stairs            Wheelchair Mobility    Modified Rankin (Stroke Patients Only)       Balance Overall balance assessment: Needs assistance Sitting-balance support: No upper extremity supported;Feet supported Sitting balance-Leahy Scale: Zero Sitting balance - Comments: Pt required total assist to sit EOB due to lethargy.                            Cognition Arousal/Alertness: Lethargic Behavior During Therapy: Flat affect Overall Cognitive Status: Difficult to assess                 General Comments: Pt aroused very briefly to voice and tactile stimulation but then back to sleep. Even when moved to sitting EOB pt lethargic.    Exercises      General Comments        Pertinent Vitals/Pain Faces Pain Scale: No hurt    Home Living                      Prior Function            PT Goals  (current goals can now be found in the care plan section) Progress towards PT goals: Not progressing toward goals - comment (lethargy)    Frequency  Min 3X/week    PT Plan Discharge plan needs to be updated    Co-evaluation             End of Session   Activity Tolerance: Patient limited by lethargy Patient left: in bed;with call bell/phone within reach;with bed alarm set     Time: [id number] PT Time Calculation (min) (ACUTE ONLY): 12 min  Charges:  $Therapeutic Activity: 8-22 mins                    G Codes:      Sydney Arroyo 29-Dec-2015, 8:56 AM [facility] PT [phone]

## 2015-12-05 NOTE — Care Management Note (Signed)
Case Management Note  Patient Details  Name: Sydney Arroyo MRN: [id number] Date of Birth: 1931/06/23  Subjective/Objective:     Patient is from [facility] (memory care), plan is to for dc back to memory care 1/10.               Action/Plan:   Expected Discharge Date:                  Expected Discharge Plan:  Assisted Living / Rest Home  In-House Referral:  Clinical Social Work  Discharge planning Services  CM Consult  Post Acute Care Choice:    Choice offered to:     DME Arranged:    DME Agency:     HH Arranged:    [place] Agency:     Status of Service:  Completed, signed off  Medicare Important Message Given:  Yes Date Medicare IM Given:    Medicare IM give by:    Date Additional Medicare IM Given:    Additional Medicare Important Message give by:     If discussed at [place] of Stay Meetings, dates discussed:    Additional Comments:  Zenon Mayo, RN 12/05/2015, 7:00 PM

## 2015-12-05 NOTE — Progress Notes (Signed)
PROGRESS NOTE  Sydney Arroyo [id number] DOB: June 28, 1931 DOA: 12/01/2015 PCP: Hollace Kinnier, DO    HPI/Subjective: Patient unable to tell her name, not oriented to place or time. Per PT she did appear weaker than when she came in.  Assessment/Plan:   Acute encephalopathy  - Dr. Silverio Decamp of neuro consulting and much appreciated  - Will stop gabapentin, Seroquel, and Depakote  - Monitor overnight with frequent neuro checks  -TSH, B12, ammonia ok - CTA: P. comm aneurysm that will need to be followed with repeat CTA in one year-- family will probably elect not to follow - Urine is turbid with many bacteria; no other systemic sign of infxn and belly not tender; may be colonization only, but given the clinical scenario, will treat with Cipro empirically; culture not sent untial after abx  Acute renal failure Creatinine increased from 0.74 up to 1.81, also BUN is high. Held nephrotoxic medication including HCTZ and Cozaar We'll treat empirically for dehydration with IV fluids given her poor oral intake. Check BMP in a.m. Creatinine improved to 1.1, decrease IV fluids to 50 mL. Check BMP in a.m.  Alzheimer Dementia with behavior disturbance  - Avoiding Seroquel and Depakote as above  - Neuro consultant notes that exam consistent with PD, planning to start amantadine?   Hypertension  - Continue home Norvasc, losartan-HCTZ, metoprolol  - BP well-controlled on this regimen currently, monitoring   Sick sinus syndrome  - Has pacer in place  - Monitoring on tele, has been NSR  UTI Presented to the hospital with turbid urine with many bacteria, treated empirically as UTI. Continue current antibiotics, adjust according to culture results.   Code Status: DNR Family Communication:  Disposition Plan: from memory care- PT eval-- from [facility], probable discharge in a.m.   Consultants:  neuro  Procedures:       Objective: Filed Vitals:   12/05/15 0154  12/05/15 0622  BP: 121/52 124/61  Pulse: 69 71  Temp: 98.5 F (36.9 C) 98.1 F (36.7 C)  Resp: 16 16    Intake/Output Summary (Last 24 hours) at 12/05/15 1406 Last data filed at 12/05/15 0946  Gross per 24 hour  Intake   2607 ml  Output      0 ml  Net   2607 ml   Filed Weights   12/01/15 1845 12/01/15 2228  Weight: 75.751 kg (167 lb) 75.9 kg (167 lb 5.3 oz)    Exam:   General:  Opens eyes, says a few words  Cardiovascular: rrr  Respiratory: clear  Abdomen: +BS, soft  Musculoskeletal: no edema   Data Reviewed: Basic Metabolic Panel:  Recent Labs Lab 12/01/15 1815 12/01/15 1818 12/01/15 2338 12/03/15 0644 12/04/15 0600 12/05/15 1020  NA 142 142  --  137 138 143  K 4.6 5.0  --  3.7 4.0 3.8  CL 101 103  --  96* 97* 108  CO2 30  --   --  30 31 23   GLUCOSE 105* 99  --  99 105* 117*  BUN 17 27*  --  13 27* 28*  CREATININE 0.71 0.60  --  0.74 1.81* 1.11*  CALCIUM 9.7  --   --  9.3 9.3 9.0  MG  --   --  1.5*  --  1.9  --   PHOS  --   --  3.2  --   --   --    Liver Function Tests:  Recent Labs Lab 12/01/15 1815  AST 55*  ALT 27  ALKPHOS 62  BILITOT 1.1  PROT 6.4*  ALBUMIN 3.2*   No results for input(s): LIPASE, AMYLASE in the last 168 hours.  Recent Labs Lab 12/01/15 2338  AMMONIA 25   CBC:  Recent Labs Lab 12/01/15 1815 12/01/15 1818 12/03/15 0644  WBC 7.3  --  7.5  NEUTROABS 3.7  --   --   HGB 16.5* 16.7* 15.1*  HCT 48.8* 49.0* 44.9  MCV 101.5*  --  99.1  PLT 128*  --  143*   Cardiac Enzymes:  Recent Labs Lab 12/04/15 0823  CKTOTAL 22*   BNP (last 3 results) No results for input(s): BNP in the last 8760 hours.  ProBNP (last 3 results) No results for input(s): PROBNP in the last 8760 hours.  CBG:  Recent Labs Lab 12/02/15 1227 12/02/15 1817 12/02/15 2155 12/04/15 0221 12/04/15 0412  GLUCAP 129* 110* 109* 101* 99    Recent Results (from the past 240 hour(s))  MRSA PCR Screening     Status: None   Collection  Time: 12/02/15  3:55 AM  Result Value Ref Range Status   MRSA by PCR NEGATIVE NEGATIVE Final    Comment:        The GeneXpert MRSA Assay (FDA approved for NASAL specimens only), is one component of a comprehensive MRSA colonization surveillance program. It is not intended to diagnose MRSA infection nor to guide or monitor treatment for MRSA infections.   Culture, Urine     Status: None   Collection Time: 12/02/15  3:01 PM  Result Value Ref Range Status   Specimen Description URINE, CLEAN CATCH  Final   Special Requests NONE  Final   Culture MULTIPLE SPECIES PRESENT, SUGGEST RECOLLECTION  Final   Report Status 12/04/2015 FINAL  Final     Studies: No results found.  Scheduled Meds: . amLODipine  5 mg Oral Daily  . cholecalciferol  1,000 Units Oral Daily  . ciprofloxacin  500 mg Oral BID  . clopidogrel  75 mg Oral Daily  . colesevelam  625 mg Oral Daily  . DULoxetine  30 mg Oral Daily  . enoxaparin (LOVENOX) injection  30 mg Subcutaneous Q24H  . memantine  28 mg Oral Daily  . metoprolol succinate  25 mg Oral Daily  . montelukast  10 mg Oral QHS   Continuous Infusions: . sodium chloride 100 mL/hr at 12/05/15 0657   Antibiotics Given (last 72 hours)    Date/Time Action Medication Dose Rate   12/02/15 1515 Given   ciprofloxacin (CIPRO) IVPB 400 mg 400 mg 200 mL/hr   12/03/15 0248 Given   ciprofloxacin (CIPRO) IVPB 400 mg 400 mg 200 mL/hr   12/03/15 1950 Given   ciprofloxacin (CIPRO) tablet 500 mg 500 mg    12/04/15 [id number] Given   ciprofloxacin (CIPRO) tablet 500 mg 500 mg    12/04/15 2102 Given   ciprofloxacin (CIPRO) tablet 500 mg 500 mg    12/05/15 [id number] Given   ciprofloxacin (CIPRO) tablet 500 mg 500 mg       Principal Problem:   Encephalopathy acute Active Problems:   Hypertension   Sick sinus syndrome (HCC)   Atrial fibrillation, new onset ([place])   Alzheimer's dementia with behavioral disturbance   Acute encephalopathy   Altered mental status    Stroke-like symptoms    Time spent: 25 min    [place] [facility] Pager [phone]. If 7PM-7AM, please contact night-coverage at [email or web address], password [facility] 12/05/2015, 2:06 PM  LOS: 3 days

## 2015-12-05 NOTE — Progress Notes (Signed)
Physical Therapy Treatment Patient Details Name: Sydney Arroyo MRN: [id number] DOB: 06/12/1931 Today's Date: 12/05/2015    History of Present Illness Pt adm with incr lethargy and confusion. Pt found to have UTI. Neurology work up and pt not felt to have CVA. PMH - alzheimers dementia, breast CA, vertigo, pacer    PT Comments    Pt much more alert than earlier and able to amb 100' with min A. Unsure of how much assist pt was receiving for mobility at ALF/memory care. If ALF/memory care can't provide min A for pt mobility then she will need to go to SNF.  Follow Up Recommendations  SNF (vs ALF. Unsure of how much assist pt was receiving at ALF. )     Equipment Recommendations  None recommended by PT    Recommendations for Other Services       Precautions / Restrictions Precautions Precautions: Fall Restrictions Weight Bearing Restrictions: No    Mobility  Bed Mobility Overal bed mobility: Needs Assistance Bed Mobility: Supine to Sit;Sit to Supine     Supine to sit: Total assist Sit to supine: Total assist   General bed mobility comments: Pt didn't assist due to lethargy.  Transfers Overall transfer level: Needs assistance   Transfers: Sit to/from Stand Sit to Stand: Min assist   Squat pivot transfers: Min assist     General transfer comment: Assist for balance and to bring hips up.  Ambulation/Gait Ambulation/Gait assistance: Min assist Ambulation Distance (Feet): 100 Feet Assistive device: Rolling walker (2 wheeled) Gait Pattern/deviations: Step-through pattern;Decreased step length - right;Decreased step length - left;Shuffle   Gait velocity interpretation: <1.8 ft/sec, indicative of risk for recurrent falls General Gait Details: Assist for balance and support.    Stairs            Wheelchair Mobility    Modified Rankin (Stroke Patients Only)       Balance Overall balance assessment: Needs assistance Sitting-balance support: No upper  extremity supported;Feet supported Sitting balance-Leahy Scale: Fair Sitting balance - Comments: Pt required total assist to sit EOB due to lethargy.   Standing balance support: Bilateral upper extremity supported Standing balance-Leahy Scale: Poor Standing balance comment: support of walker and min guard for static standing                    Cognition Arousal/Alertness: Awake/alert Behavior During Therapy: Flat affect Overall Cognitive Status: No family/caregiver present to determine baseline cognitive functioning Area of Impairment: Orientation;Attention;Problem solving;Following commands Orientation Level: Disoriented to;Place;Time;Situation;Person Current Attention Level: Sustained Memory: Decreased short-term memory Following Commands: Follows one step commands with increased time     Problem Solving: Slow processing;Requires verbal cues;Requires tactile cues;Difficulty sequencing General Comments: Pt much more alert than earlier.    Exercises      General Comments        Pertinent Vitals/Pain Faces Pain Scale: No hurt    Home Living                      Prior Function            PT Goals (current goals can now be found in the care plan section) Progress towards PT goals: Progressing toward goals    Frequency  Min 3X/week    PT Plan Discharge plan needs to be updated    Co-evaluation             End of Session Equipment Utilized During Treatment: Gait belt Activity Tolerance: Patient tolerated  treatment well Patient left: with call bell/phone within reach;in chair;with chair alarm set     Time: 1113-1130 PT Time Calculation (min) (ACUTE ONLY): 17 min  Charges:  $Gait Training: 8-22 mins $Therapeutic Activity: 8-22 mins                    G Codes:      Burlon Centrella 25-Dec-2015, 12:34 PM [facility] PT [phone]

## 2015-12-05 NOTE — Care Management Important Message (Signed)
Important Message  Patient Details  Name: Sydney Arroyo MRN: [id number] Date of Birth: [birth year]   Medicare Important Message Given:  Yes    [name] 12/05/2015, 1:53 PMImportant Message  Patient Details  Name: Sydney Arroyo MRN: [id number] Date of Birth: 05/11/1931   Medicare Important Message Given:  Yes    [name], Neal Dy 12/05/2015, 1:53 PM

## 2015-12-06 DIAGNOSIS — R400 Somnolence: Secondary | ICD-10-CM

## 2015-12-06 LAB — BASIC METABOLIC PANEL
ANION GAP: 8 (ref 5–15)
BUN: 19 mg/dL (ref 6–20)
CHLORIDE: 106 mmol/L (ref 101–111)
CO2: 29 mmol/L (ref 22–32)
Calcium: 8.8 mg/dL — ABNORMAL LOW (ref 8.9–10.3)
Creatinine, Ser: 0.86 mg/dL (ref 0.44–1.00)
GFR calc Af Amer: >60 mL/min (ref >60)
GLUCOSE: 96 mg/dL (ref 65–99)
POTASSIUM: 3.3 mmol/L — AB (ref 3.5–5.1)
Sodium: 143 mmol/L (ref 135–145)

## 2015-12-06 LAB — FOLATE RBC
Folate, Hemolysate: 374.3 ng/mL
Folate, RBC: 837 ng/mL (ref >498)
HEMATOCRIT: 44.7 % (ref 34.0–46.6)

## 2015-12-06 LAB — GLUCOSE, CAPILLARY: Glucose-Capillary: 91 mg/dL (ref 65–99)

## 2015-12-06 MED ORDER — LORAZEPAM 0.5 MG PO TABS: 0.2500 mg | ORAL_TABLET | Freq: Four times a day (QID) | ORAL | Status: DC | PRN

## 2015-12-06 MED ORDER — AMANTADINE HCL 100 MG PO CAPS: 100.0000 mg | ORAL_CAPSULE | Freq: Every day | ORAL | Status: DC

## 2015-12-06 MED ORDER — POTASSIUM CHLORIDE CRYS ER 20 MEQ PO TBCR
40.0000 meq | EXTENDED_RELEASE_TABLET | Freq: Once | ORAL | Status: AC
  Administered 2015-12-06: 40 meq via ORAL
  Filled 2015-12-06: qty 2

## 2015-12-06 NOTE — Discharge Summary (Signed)
Physician Discharge Summary  Sydney Arroyo [id number] DOB: May 25, 1931 DOA: 12/01/2015  PCP: Hollace Kinnier, DO  Admit date: 12/01/2015 Discharge date: 12/06/2015  Time spent: *40* minutes  Recommendations for Outpatient Follow-up:   Follow-up with primary care physician within one week.  Keep patient hydrated check BMP within 1 week.  Consider discontinuation of Antivert and Depakote if patient develops lethargy again.  Discharge Diagnoses:  Principal Problem:   Encephalopathy acute Active Problems:   Hypertension   Sick sinus syndrome (HCC)   Alzheimer's dementia with behavioral disturbance   Acute encephalopathy   Altered mental status   Stroke-like symptoms   Discharge Condition: Table  Diet recommendation: Healthy  Filed Weights   12/01/15 1845 12/01/15 2228 12/06/15 0544  Weight: 75.751 kg (167 lb) 75.9 kg (167 lb 5.3 oz) 73.7 kg (162 lb 7.7 oz)    History of present illness:  Sydney Arroyo is a medically-complex 82 y.o. female with PMH of Alzheimer dementia with behavioral disturbances, tachy-brady syndrome status post pacer, and hypertension who [facility] from [facility] for evaluation of lethargy and increased confusion. Patient is had a waxing and waning course of lethargy and increased confusion over the last month and has lost 9 pounds over this interval, attributed to poor appetite and lethargy. Patient was recently evaluated by a PA at her facility for these issues and her Depakote dose was decreased. On the day of admission, patient's morning started off unremarkable but after her midmorning nap, caretakers had great difficulty arousing the patient and painful stimuli were required to get a response. Unfortunately, the patient is unable to provide any meaningful history at this time due to confusion and baseline dementia. History is obtained from discussion with caretakers, chart review, and ED personnel.  In ED, patient was found to be  afebrile, saturating well on room air, with stable vital signs. Noncontrast head CT featured a subtle ill-defined hypodensity in the left periventricular white matter that was thought to possibly represent acute ischemia. Consulting neurologist, Dr. Silverio Decamp felt that the finding was more likely attributable to chronic small vessel disease. Initial blood work was largely unrevealing, EKG demonstrated normal sinus rhythm, patient was sent for CT angiogram, and the hospitalists were asked to admit for ongoing management and evaluation.  Hospital Course:    Acute encephalopathy  - Dr. Silverio Decamp of neuro consulting and much appreciated  - Gabapentin, Seroquel, and Depakote, on discharge gabapentin discontinued   -TSH, B12, ammonia ok - CTA: P. comm aneurysm that will need to be followed with repeat CTA in one year-- family will probably elect not to follow - Treated for UTI with Cipro for 3 days. - Not very clear etiology of acute encephalopathy, could be secondary to UTI, hydration versus medications. - On discharge: Patient is awake and alert, oriented only to self, this is presumed to be her baseline. -Neurology who recommended amantadine 100 mg daily for Parkinson's disease  Acute renal failure -Creatinine increased from 0.74 up to 1.81, also BUN is high. -Held nephrotoxic medication including HCTZ and Cozaar -We'll treat empirically for dehydration with IV fluids given her poor oral intake.  -Creatinine is 0.86 on discharge, teeth patient hydrated.  Alzheimer Dementia with behavior disturbance  - On admission Seroquel and Depakote as well as Neurontin held. - Neuro consultant notes that exam consistent with PD, planning to start amantadine?  - Seen by PT who recommended SNF.  Hypertension  - Continue home Norvasc, losartan-HCTZ, metoprolol  - BP well-controlled on this regimen currently,  monitoring   Sick sinus syndrome  - Has pacer in place  - Monitoring on tele, has been  NSR  UTI Presented to the hospital with turbid urine with many bacteria, treated empirically as UTI. Continue current antibiotics, adjust according to culture results.  Hypokalemia Potassium is 3.3 on discharge, potassium supplement given, check BMP in 1 week.  Procedures:  None  Consultations:  Neurology  Discharge Exam: Filed Vitals:   12/06/15 0544 12/06/15 1004  BP: 149/61 144/56  Pulse: 62 70  Temp: 98.7 F (37.1 C) 97.7 F (36.5 C)  Resp: 18 18   General: Alert and awake, oriented only to self, not in any acute distress. HEENT: anicteric sclera, pupils reactive to light and accommodation, EOMI CVS: S1-S2 clear, no murmur rubs or gallops Chest: clear to auscultation bilaterally, no wheezing, rales or rhonchi Abdomen: soft nontender, nondistended, normal bowel sounds, no organomegaly Extremities: no cyanosis, clubbing or edema noted bilaterally Neuro: Cranial nerves II-XII intact, no focal neurological deficits  Discharge Instructions   Discharge Instructions    Diet - low sodium heart healthy    Complete by:  As directed      Increase activity slowly    Complete by:  As directed           Current Discharge Medication List    START taking these medications   Details  amantadine (SYMMETREL) 100 MG capsule Take 1 capsule (100 mg total) by mouth daily.      CONTINUE these medications which have CHANGED   Details  LORazepam (ATIVAN) 0.5 MG tablet Take 0.5 tablets (0.25 mg total) by mouth every 6 (six) hours as needed for anxiety. Qty: 10 tablet, Refills: 0      CONTINUE these medications which have NOT CHANGED   Details  acetaminophen (TYLENOL) 500 MG tablet Take 1,000 mg by mouth every 8 (eight) hours as needed for mild pain.     amLODipine (NORVASC) 5 MG tablet Take 5 mg by mouth daily.    bisacodyl (DULCOLAX) 10 MG suppository Place 10 mg rectally as needed for moderate constipation.    Cholecalciferol (VITAMIN D3) 2000 UNITS TABS Take 1 tablet by  mouth daily.    colesevelam (WELCHOL) 625 MG tablet Take 625 mg by mouth daily. For cholesterol    divalproex (DEPAKOTE SPRINKLE) 125 MG capsule Take 125 mg by mouth 2 (two) times daily.    donepezil (ARICEPT) 10 MG tablet Take 10 mg by mouth daily.     DULoxetine (CYMBALTA) 60 MG capsule Take 1 capsule (60 mg total) by mouth daily. Qty: 30 capsule, Refills: 3    losartan-hydrochlorothiazide (HYZAAR) 100-25 MG per tablet Take 1 tablet by mouth daily.    meclizine (ANTIVERT) 25 MG tablet Take 25 mg by mouth 3 (three) times daily as needed for dizziness.    Memantine HCl ER (NAMENDA XR) 28 MG CP24 Take 28 mg by mouth every morning. For memory    metoprolol succinate (TOPROL-XL) 25 MG 24 hr tablet Take 25 mg by mouth daily.    montelukast (SINGULAIR) 10 MG tablet Take 10 mg by mouth at bedtime.      STOP taking these medications     amoxicillin (AMOXIL) 500 MG tablet      gabapentin (NEURONTIN) 100 MG capsule        Allergies  Allergen Reactions  . Demerol [Meperidine] Nausea And Vomiting  . Ketorolac Other (See Comments)    unknown  . Percocet [Oxycodone-Acetaminophen] Other (See Comments)    unknown  .  Toradol [Ketorolac Tromethamine]     UNKNOWN   Follow-up Information    Follow up with REED, TIFFANY, DO In 1 week.   Specialty:  Geriatric Medicine   Contact information:   [place] [place] [address] [phone]        The results of significant diagnostics from this hospitalization (including imaging, microbiology, ancillary and laboratory) are listed below for reference.    Significant Diagnostic Studies: Ct Angio Head W/cm &/or Wo Cm  12/01/2015  CLINICAL DATA:  Initial evaluation for possible right facial droop and right-sided weakness. EXAM: CT ANGIOGRAPHY HEAD AND NECK TECHNIQUE: Multidetector CT imaging of the head and neck was performed using the standard protocol during bolus administration of intravenous contrast. Multiplanar CT image  reconstructions and MIPs were obtained to evaluate the vascular anatomy. Carotid stenosis measurements (when applicable) are obtained utilizing NASCET criteria, using the distal internal carotid diameter as the denominator. CONTRAST:  140mL OMNIPAQUE IOHEXOL 350 MG/ML SOLN COMPARISON:  Prior head CT from earlier the same day. FINDINGS: CTA NECK Aortic arch: Visualized aortic arch is of normal caliber with normal 3 vessel morphology. Scattered atheromatous plaque present within the arch itself without stenosis. Scattered plaque present within the proximal subclavian arteries without high-grade flow-limiting stenosis. Right carotid system: Origin of right common carotid artery widely patent. The proximal right common carotid artery is tortuous proximally. Right common carotid artery widely patent to its distal aspect. Atheromatous plaque within the distal right common carotid artery prior to the bifurcation results in moderate stenosis (approximately 40-50%. Distally, there is extensive atheromatous plaque about the right carotid bifurcation/proximal right ICA. There is short-segment atheromatous stenosis at the origin of the right ICA avid least 80-90% by NASCET criteria (series 502, image 215). Distally, right ICA widely patent to the skullbase. Left carotid system: Left common carotid artery patent from its origin to the bifurcation. Scattered calcified plaque about the left carotid bifurcation/proximal left ICA with associated short segment stenosis of approximately 50% by NASCET criteria. Distally, left ICA is tortuous but widely patent to the skullbase. Vertebral arteries:Both vertebral arteries arise from the subclavian arteries. Origin of the vertebral arteries widely patent. Pre foraminal V1 segments are tortuous. Vertebral arteries patent along their entire course without focal stenosis, dissection, or vascular occlusion. Skeleton: Reversal of the normal cervical lordosis. Trace anterolisthesis of C2 on C3.  Advanced degenerative spondylolysis at C4-5 and C6-7. No acute osseous abnormality. Other neck: Visualized lungs demonstrate no acute process. Partially visualized superior mediastinum normal. Subcentimeter hypodense nodule within the right lobe of thyroid noted, of doubtful clinical significance. No adenopathy. No acute soft tissue abnormality within the neck. CTA HEAD Anterior circulation: Petrous segments widely patent. Smooth atheromatous plaque throughout the carotid siphons with resultant mild diffuse narrowing. Plaque extends into the supraclinoid segments without significant stenosis. Left A1 segment slightly hypoplastic but widely patent. Right A1 segment patent. Anterior communicating artery normal. Anterior cerebral arteries opacified to their distal aspects. M1 segments widely patent without stenosis or occlusion. MCA bifurcations normal. No proximal M2 branch occlusion. Short short-segment moderate stenosis within a proximal left M2 branch, seen best on MIP reconstructions (series 507, image 20). MCA branches well opacified distally and symmetric. Posterior circulation: Vertebral arteries patent to the vertebrobasilar junction. Posterior inferior cerebral arteries patent. Basilar artery tortuous but widely patent. Superior cerebellar arteries well opacified bilaterally. Both posterior cerebral arteries arise from the basilar artery. PCAs resting the basilar artery and are well opacified to their distal aspects. Mild distal small vessel changes  within the distal PCAs. Venous sinuses: Patent without venous sinus thrombosis. Anatomic variants: No anatomic variant. 3 x 3 x 4 mm focal outpouching arising from the supraclinoid right ICA consistent with a small posterior communicating artery aneurysm (series 504, image 94). Delayed phase: No abnormal enhancement on delayed sequence. CT PERFUSION: No perfusion mismatch to suggest acute ischemia identified. Subtle elevated mean transit time and time to peak  within the anterior left MCA distribution, like related to the previously identified proximal left M2 branch stenosis. No other perfusion abnormality. IMPRESSION: CTA NECK IMPRESSION: 1. Atheromatous plaque at the right carotid bifurcation/proximal right ICA with associated short segment stenosis of 80-90% by NASCET criteria at the origin of the right ICA. 2. Atheromatous stenosis of approximately 50% at the origin of the left ICA. 3. Widely patent vertebral arteries. CTA HEAD IMPRESSION: 1. No large or proximal arterial branch occlusion. 2. Short-segment moderate proximal left M2 stenosis as above. 3. 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 CT PERFUSION: 1. No perfusion mismatch to suggest acute ischemia. 2. 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 Critical Value/emergent results were called by telephone at the time of interpretation on 12/01/2015 at 10:49 pm to Dr. Armida Sans , who verbally acknowledged these results. Electronically Signed   By: Jeannine Boga M.D.   On: 12/01/2015 23:05   Ct Head Wo Contrast  12/01/2015  CLINICAL DATA:  Code stroke. Right-sided weakness and slurred speech. EXAM: CT HEAD WITHOUT CONTRAST TECHNIQUE: Contiguous axial images were obtained from the base of the skull through the vertex without intravenous contrast. COMPARISON:  None. FINDINGS: Generalized atrophy and chronic small vessel ischemia. Slightly more confluent ill-defined hypodensity and obscuration of gray-white differentiation in the periventricular left frontal lobe. No intracranial hemorrhage. No intracranial or extra-axial fluid collection. No hydrocephalus. Basal cisterns are patent. The calvarium is intact. Included paranasal sinuses and mastoid air cells are well aerated. IMPRESSION: 1. Subtle ill-defined hypodensity within the left periventricular white matter, concerning for acute ischemia. There are no prior exams available  for comparison. 2. Background atrophy and chronic small vessel ischemic change. These results were called by telephone at the time of interpretation on 12/01/2015 at 6:30 pm to Dr. Silverio Decamp , who verbally acknowledged these results. Electronically Signed   By: Jeb Levering M.D.   On: 12/01/2015 18:32   Ct Angio Neck W/cm &/or Wo/cm  12/01/2015  CLINICAL DATA:  Initial evaluation for possible right facial droop and right-sided weakness. EXAM: CT ANGIOGRAPHY HEAD AND NECK TECHNIQUE: Multidetector CT imaging of the head and neck was performed using the standard protocol during bolus administration of intravenous contrast. Multiplanar CT image reconstructions and MIPs were obtained to evaluate the vascular anatomy. Carotid stenosis measurements (when applicable) are obtained utilizing NASCET criteria, using the distal internal carotid diameter as the denominator. CONTRAST:  177mL OMNIPAQUE IOHEXOL 350 MG/ML SOLN COMPARISON:  Prior head CT from earlier the same day. FINDINGS: CTA NECK Aortic arch: Visualized aortic arch is of normal caliber with normal 3 vessel morphology. Scattered atheromatous plaque present within the arch itself without stenosis. Scattered plaque present within the proximal subclavian arteries without high-grade flow-limiting stenosis. Right carotid system: Origin of right common carotid artery widely patent. The proximal right common carotid artery is tortuous proximally. Right common carotid artery widely patent to its distal aspect. Atheromatous plaque within the distal right common carotid artery prior to the bifurcation results in moderate  stenosis (approximately 40-50%. Distally, there is extensive atheromatous plaque about the right carotid bifurcation/proximal right ICA. There is short-segment atheromatous stenosis at the origin of the right ICA avid least 80-90% by NASCET criteria (series 502, image 215). Distally, right ICA widely patent to the skullbase. Left carotid system: Left  common carotid artery patent from its origin to the bifurcation. Scattered calcified plaque about the left carotid bifurcation/proximal left ICA with associated short segment stenosis of approximately 50% by NASCET criteria. Distally, left ICA is tortuous but widely patent to the skullbase. Vertebral arteries:Both vertebral arteries arise from the subclavian arteries. Origin of the vertebral arteries widely patent. Pre foraminal V1 segments are tortuous. Vertebral arteries patent along their entire course without focal stenosis, dissection, or vascular occlusion. Skeleton: Reversal of the normal cervical lordosis. Trace anterolisthesis of C2 on C3. Advanced degenerative spondylolysis at C4-5 and C6-7. No acute osseous abnormality. Other neck: Visualized lungs demonstrate no acute process. Partially visualized superior mediastinum normal. Subcentimeter hypodense nodule within the right lobe of thyroid noted, of doubtful clinical significance. No adenopathy. No acute soft tissue abnormality within the neck. CTA HEAD Anterior circulation: Petrous segments widely patent. Smooth atheromatous plaque throughout the carotid siphons with resultant mild diffuse narrowing. Plaque extends into the supraclinoid segments without significant stenosis. Left A1 segment slightly hypoplastic but widely patent. Right A1 segment patent. Anterior communicating artery normal. Anterior cerebral arteries opacified to their distal aspects. M1 segments widely patent without stenosis or occlusion. MCA bifurcations normal. No proximal M2 branch occlusion. Short short-segment moderate stenosis within a proximal left M2 branch, seen best on MIP reconstructions (series 507, image 20). MCA branches well opacified distally and symmetric. Posterior circulation: Vertebral arteries patent to the vertebrobasilar junction. Posterior inferior cerebral arteries patent. Basilar artery tortuous but widely patent. Superior cerebellar arteries well opacified  bilaterally. Both posterior cerebral arteries arise from the basilar artery. PCAs resting the basilar artery and are well opacified to their distal aspects. Mild distal small vessel changes within the distal PCAs. Venous sinuses: Patent without venous sinus thrombosis. Anatomic variants: No anatomic variant. 3 x 3 x 4 mm focal outpouching arising from the supraclinoid right ICA consistent with a small posterior communicating artery aneurysm (series 504, image 94). Delayed phase: No abnormal enhancement on delayed sequence. CT PERFUSION: No perfusion mismatch to suggest acute ischemia identified. Subtle elevated mean transit time and time to peak within the anterior left MCA distribution, like related to the previously identified proximal left M2 branch stenosis. No other perfusion abnormality. IMPRESSION: CTA NECK IMPRESSION: 1. Atheromatous plaque at the right carotid bifurcation/proximal right ICA with associated short segment stenosis of 80-90% by NASCET criteria at the origin of the right ICA. 2. Atheromatous stenosis of approximately 50% at the origin of the left ICA. 3. Widely patent vertebral arteries. CTA HEAD IMPRESSION: 1. No large or proximal arterial branch occlusion. 2. Short-segment moderate proximal left M2 stenosis as above. 3. 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 CT PERFUSION: 1. No perfusion mismatch to suggest acute ischemia. 2. 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 Critical Value/emergent results were called by telephone at the time of interpretation on 12/01/2015 at 10:49 pm to Dr. Armida Sans , who verbally acknowledged these results. Electronically Signed   By: Jeannine Boga M.D.   On: 12/01/2015 23:05   Ct Cerebral Perfusion W/cm  12/01/2015  CLINICAL DATA:  Initial evaluation for possible right facial  droop and right-sided weakness. EXAM: CT ANGIOGRAPHY HEAD AND NECK TECHNIQUE: Multidetector CT  imaging of the head and neck was performed using the standard protocol during bolus administration of intravenous contrast. Multiplanar CT image reconstructions and MIPs were obtained to evaluate the vascular anatomy. Carotid stenosis measurements (when applicable) are obtained utilizing NASCET criteria, using the distal internal carotid diameter as the denominator. CONTRAST:  165mL OMNIPAQUE IOHEXOL 350 MG/ML SOLN COMPARISON:  Prior head CT from earlier the same day. FINDINGS: CTA NECK Aortic arch: Visualized aortic arch is of normal caliber with normal 3 vessel morphology. Scattered atheromatous plaque present within the arch itself without stenosis. Scattered plaque present within the proximal subclavian arteries without high-grade flow-limiting stenosis. Right carotid system: Origin of right common carotid artery widely patent. The proximal right common carotid artery is tortuous proximally. Right common carotid artery widely patent to its distal aspect. Atheromatous plaque within the distal right common carotid artery prior to the bifurcation results in moderate stenosis (approximately 40-50%. Distally, there is extensive atheromatous plaque about the right carotid bifurcation/proximal right ICA. There is short-segment atheromatous stenosis at the origin of the right ICA avid least 80-90% by NASCET criteria (series 502, image 215). Distally, right ICA widely patent to the skullbase. Left carotid system: Left common carotid artery patent from its origin to the bifurcation. Scattered calcified plaque about the left carotid bifurcation/proximal left ICA with associated short segment stenosis of approximately 50% by NASCET criteria. Distally, left ICA is tortuous but widely patent to the skullbase. Vertebral arteries:Both vertebral arteries arise from the subclavian arteries. Origin of the vertebral arteries widely patent. Pre foraminal V1 segments are tortuous. Vertebral arteries patent along their entire course  without focal stenosis, dissection, or vascular occlusion. Skeleton: Reversal of the normal cervical lordosis. Trace anterolisthesis of C2 on C3. Advanced degenerative spondylolysis at C4-5 and C6-7. No acute osseous abnormality. Other neck: Visualized lungs demonstrate no acute process. Partially visualized superior mediastinum normal. Subcentimeter hypodense nodule within the right lobe of thyroid noted, of doubtful clinical significance. No adenopathy. No acute soft tissue abnormality within the neck. CTA HEAD Anterior circulation: Petrous segments widely patent. Smooth atheromatous plaque throughout the carotid siphons with resultant mild diffuse narrowing. Plaque extends into the supraclinoid segments without significant stenosis. Left A1 segment slightly hypoplastic but widely patent. Right A1 segment patent. Anterior communicating artery normal. Anterior cerebral arteries opacified to their distal aspects. M1 segments widely patent without stenosis or occlusion. MCA bifurcations normal. No proximal M2 branch occlusion. Short short-segment moderate stenosis within a proximal left M2 branch, seen best on MIP reconstructions (series 507, image 20). MCA branches well opacified distally and symmetric. Posterior circulation: Vertebral arteries patent to the vertebrobasilar junction. Posterior inferior cerebral arteries patent. Basilar artery tortuous but widely patent. Superior cerebellar arteries well opacified bilaterally. Both posterior cerebral arteries arise from the basilar artery. PCAs resting the basilar artery and are well opacified to their distal aspects. Mild distal small vessel changes within the distal PCAs. Venous sinuses: Patent without venous sinus thrombosis. Anatomic variants: No anatomic variant. 3 x 3 x 4 mm focal outpouching arising from the supraclinoid right ICA consistent with a small posterior communicating artery aneurysm (series 504, image 94). Delayed phase: No abnormal enhancement on  delayed sequence. CT PERFUSION: No perfusion mismatch to suggest acute ischemia identified. Subtle elevated mean transit time and time to peak within the anterior left MCA distribution, like related to the previously identified proximal left M2 branch stenosis. No other perfusion abnormality. IMPRESSION: CTA NECK IMPRESSION:  1. Atheromatous plaque at the right carotid bifurcation/proximal right ICA with associated short segment stenosis of 80-90% by NASCET criteria at the origin of the right ICA. 2. Atheromatous stenosis of approximately 50% at the origin of the left ICA. 3. Widely patent vertebral arteries. CTA HEAD IMPRESSION: 1. No large or proximal arterial branch occlusion. 2. Short-segment moderate proximal left M2 stenosis as above. 3. 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 CT PERFUSION: 1. No perfusion mismatch to suggest acute ischemia. 2. 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 Critical Value/emergent results were called by telephone at the time of interpretation on 12/01/2015 at 10:49 pm to Dr. Armida Sans , who verbally acknowledged these results. Electronically Signed   By: Jeannine Boga M.D.   On: 12/01/2015 23:05    Microbiology: Recent Results (from the past 240 hour(s))  MRSA PCR Screening     Status: None   Collection Time: 12/02/15  3:55 AM  Result Value Ref Range Status   MRSA by PCR NEGATIVE NEGATIVE Final    Comment:        The GeneXpert MRSA Assay (FDA approved for NASAL specimens only), is one component of a comprehensive MRSA colonization surveillance program. It is not intended to diagnose MRSA infection nor to guide or monitor treatment for MRSA infections.   Culture, Urine     Status: None   Collection Time: 12/02/15  3:01 PM  Result Value Ref Range Status   Specimen Description URINE, CLEAN CATCH  Final   Special Requests NONE  Final   Culture MULTIPLE SPECIES PRESENT,  SUGGEST RECOLLECTION  Final   Report Status 12/04/2015 FINAL  Final     Labs: Basic Metabolic Panel:  Recent Labs Lab 12/01/15 1815 12/01/15 1818 12/01/15 2338 12/03/15 0644 12/04/15 0600 12/05/15 1020 12/06/15 0507  NA 142 142  --  137 138 143 143  K 4.6 5.0  --  3.7 4.0 3.8 3.3*  CL 101 103  --  96* 97* 108 106  CO2 30  --   --  30 31 23 29   GLUCOSE 105* 99  --  99 105* 117* 96  BUN 17 27*  --  13 27* 28* 19  CREATININE 0.71 0.60  --  0.74 1.81* 1.11* 0.86  CALCIUM 9.7  --   --  9.3 9.3 9.0 8.8*  MG  --   --  1.5*  --  1.9  --   --   PHOS  --   --  3.2  --   --   --   --    Liver Function Tests:  Recent Labs Lab 12/01/15 1815  AST 55*  ALT 27  ALKPHOS 62  BILITOT 1.1  PROT 6.4*  ALBUMIN 3.2*   No results for input(s): LIPASE, AMYLASE in the last 168 hours.  Recent Labs Lab 12/01/15 2338  AMMONIA 25   CBC:  Recent Labs Lab 12/01/15 1815 12/01/15 1818 12/03/15 0644  WBC 7.3  --  7.5  NEUTROABS 3.7  --   --   HGB 16.5* 16.7* 15.1*  HCT 48.8* 49.0* 44.9  MCV 101.5*  --  99.1  PLT 128*  --  143*   Cardiac Enzymes:  Recent Labs Lab 12/04/15 0823  CKTOTAL 22*   BNP: BNP (last 3 results) No results for input(s): BNP in the last 8760 hours.  ProBNP (last 3 results) No results for input(s):  PROBNP in the last 8760 hours.  CBG:  Recent Labs Lab 12/02/15 1817 12/02/15 2155 12/04/15 0221 12/04/15 0412 12/06/15  GLUCAP 110* 109* 101* 99 91       Signed:  Dorita Rowlands A MD.  [facility] 12/06/2015, 10:13 AM

## 2015-12-06 NOTE — Progress Notes (Signed)
Patient will DC to: [facility] Anticipated DC date: 12/06/15 Family notified: daughter Transport by: [facility] 2pm  CSW signing off.  Cedric Fishman, [place] Social Worker [phone]

## 2015-12-06 NOTE — Care Management Note (Signed)
Case Management Note  Patient Details  Name: Sydney Arroyo MRN: [id number] Date of Birth: October 24, 1931  Subjective/Objective:    Patient for dc back to [facility] to memory care.                Action/Plan:   Expected Discharge Date:                  Expected Discharge Plan:  Assisted Living / Rest Home  In-House Referral:  Clinical Social Work  Discharge planning Services  CM Consult  Post Acute Care Choice:    Choice offered to:     DME Arranged:    DME Agency:     HH Arranged:    [place] Agency:     Status of Service:  Completed, signed off  Medicare Important Message Given:  Yes Date Medicare IM Given:    Medicare IM give by:    Date Additional Medicare IM Given:    Additional Medicare Important Message give by:     If discussed at [place] of Stay Meetings, dates discussed:    Additional Comments:  Zenon Mayo, RN 12/06/2015, 2:46 PM

## 2015-12-06 NOTE — Progress Notes (Signed)
Patient was discharged nursing home [facility]) by MD order; discharged instructions  review and sent to facility with care notes; IV D/C; facility was called and report was given to nurse Andee Poles; patient will be transported to facility by facility wheelchair van.

## 2015-12-07 DIAGNOSIS — G308 Other Alzheimer's disease: Secondary | ICD-10-CM | POA: Diagnosis not present

## 2015-12-07 DIAGNOSIS — R633 Feeding difficulties: Secondary | ICD-10-CM | POA: Diagnosis not present

## 2015-12-07 DIAGNOSIS — M6281 Muscle weakness (generalized): Secondary | ICD-10-CM | POA: Diagnosis not present

## 2015-12-07 DIAGNOSIS — N390 Urinary tract infection, site not specified: Secondary | ICD-10-CM | POA: Diagnosis not present

## 2015-12-07 DIAGNOSIS — R2681 Unsteadiness on feet: Secondary | ICD-10-CM | POA: Diagnosis not present

## 2015-12-07 DIAGNOSIS — G9349 Other encephalopathy: Secondary | ICD-10-CM | POA: Diagnosis not present

## 2015-12-07 DIAGNOSIS — M4806 Spinal stenosis, lumbar region: Secondary | ICD-10-CM | POA: Diagnosis not present

## 2015-12-07 DIAGNOSIS — G218 Other secondary parkinsonism: Secondary | ICD-10-CM | POA: Diagnosis not present

## 2015-12-07 DIAGNOSIS — R2689 Other abnormalities of gait and mobility: Secondary | ICD-10-CM | POA: Diagnosis not present

## 2015-12-07 DIAGNOSIS — R278 Other lack of coordination: Secondary | ICD-10-CM | POA: Diagnosis not present

## 2015-12-08 DIAGNOSIS — M6281 Muscle weakness (generalized): Secondary | ICD-10-CM | POA: Diagnosis not present

## 2015-12-08 DIAGNOSIS — R633 Feeding difficulties: Secondary | ICD-10-CM | POA: Diagnosis not present

## 2015-12-08 DIAGNOSIS — R2689 Other abnormalities of gait and mobility: Secondary | ICD-10-CM | POA: Diagnosis not present

## 2015-12-08 DIAGNOSIS — I10 Essential (primary) hypertension: Secondary | ICD-10-CM | POA: Diagnosis not present

## 2015-12-08 DIAGNOSIS — G218 Other secondary parkinsonism: Secondary | ICD-10-CM | POA: Diagnosis not present

## 2015-12-08 DIAGNOSIS — G308 Other Alzheimer's disease: Secondary | ICD-10-CM | POA: Diagnosis not present

## 2015-12-08 DIAGNOSIS — R278 Other lack of coordination: Secondary | ICD-10-CM | POA: Diagnosis not present

## 2015-12-09 DIAGNOSIS — R2689 Other abnormalities of gait and mobility: Secondary | ICD-10-CM | POA: Diagnosis not present

## 2015-12-09 DIAGNOSIS — M6281 Muscle weakness (generalized): Secondary | ICD-10-CM | POA: Diagnosis not present

## 2015-12-09 DIAGNOSIS — G218 Other secondary parkinsonism: Secondary | ICD-10-CM | POA: Diagnosis not present

## 2015-12-09 DIAGNOSIS — G308 Other Alzheimer's disease: Secondary | ICD-10-CM | POA: Diagnosis not present

## 2015-12-09 DIAGNOSIS — R633 Feeding difficulties: Secondary | ICD-10-CM | POA: Diagnosis not present

## 2015-12-09 DIAGNOSIS — R278 Other lack of coordination: Secondary | ICD-10-CM | POA: Diagnosis not present

## 2015-12-11 DIAGNOSIS — R278 Other lack of coordination: Secondary | ICD-10-CM | POA: Diagnosis not present

## 2015-12-11 DIAGNOSIS — G218 Other secondary parkinsonism: Secondary | ICD-10-CM | POA: Diagnosis not present

## 2015-12-11 DIAGNOSIS — R633 Feeding difficulties: Secondary | ICD-10-CM | POA: Diagnosis not present

## 2015-12-11 DIAGNOSIS — M6281 Muscle weakness (generalized): Secondary | ICD-10-CM | POA: Diagnosis not present

## 2015-12-11 DIAGNOSIS — R2689 Other abnormalities of gait and mobility: Secondary | ICD-10-CM | POA: Diagnosis not present

## 2015-12-11 DIAGNOSIS — G308 Other Alzheimer's disease: Secondary | ICD-10-CM | POA: Diagnosis not present

## 2015-12-12 DIAGNOSIS — G308 Other Alzheimer's disease: Secondary | ICD-10-CM | POA: Diagnosis not present

## 2015-12-12 DIAGNOSIS — M6281 Muscle weakness (generalized): Secondary | ICD-10-CM | POA: Diagnosis not present

## 2015-12-12 DIAGNOSIS — R2689 Other abnormalities of gait and mobility: Secondary | ICD-10-CM | POA: Diagnosis not present

## 2015-12-12 DIAGNOSIS — R633 Feeding difficulties: Secondary | ICD-10-CM | POA: Diagnosis not present

## 2015-12-12 DIAGNOSIS — G218 Other secondary parkinsonism: Secondary | ICD-10-CM | POA: Diagnosis not present

## 2015-12-12 DIAGNOSIS — R278 Other lack of coordination: Secondary | ICD-10-CM | POA: Diagnosis not present

## 2015-12-13 DIAGNOSIS — R278 Other lack of coordination: Secondary | ICD-10-CM | POA: Diagnosis not present

## 2015-12-13 DIAGNOSIS — R633 Feeding difficulties: Secondary | ICD-10-CM | POA: Diagnosis not present

## 2015-12-13 DIAGNOSIS — R2689 Other abnormalities of gait and mobility: Secondary | ICD-10-CM | POA: Diagnosis not present

## 2015-12-13 DIAGNOSIS — G218 Other secondary parkinsonism: Secondary | ICD-10-CM | POA: Diagnosis not present

## 2015-12-13 DIAGNOSIS — G308 Other Alzheimer's disease: Secondary | ICD-10-CM | POA: Diagnosis not present

## 2015-12-13 DIAGNOSIS — M6281 Muscle weakness (generalized): Secondary | ICD-10-CM | POA: Diagnosis not present

## 2015-12-14 DIAGNOSIS — R2689 Other abnormalities of gait and mobility: Secondary | ICD-10-CM | POA: Diagnosis not present

## 2015-12-14 DIAGNOSIS — G308 Other Alzheimer's disease: Secondary | ICD-10-CM | POA: Diagnosis not present

## 2015-12-14 DIAGNOSIS — G218 Other secondary parkinsonism: Secondary | ICD-10-CM | POA: Diagnosis not present

## 2015-12-14 DIAGNOSIS — R633 Feeding difficulties: Secondary | ICD-10-CM | POA: Diagnosis not present

## 2015-12-14 DIAGNOSIS — R278 Other lack of coordination: Secondary | ICD-10-CM | POA: Diagnosis not present

## 2015-12-14 DIAGNOSIS — M6281 Muscle weakness (generalized): Secondary | ICD-10-CM | POA: Diagnosis not present

## 2015-12-15 DIAGNOSIS — R2689 Other abnormalities of gait and mobility: Secondary | ICD-10-CM | POA: Diagnosis not present

## 2015-12-15 DIAGNOSIS — R278 Other lack of coordination: Secondary | ICD-10-CM | POA: Diagnosis not present

## 2015-12-15 DIAGNOSIS — R633 Feeding difficulties: Secondary | ICD-10-CM | POA: Diagnosis not present

## 2015-12-15 DIAGNOSIS — M6281 Muscle weakness (generalized): Secondary | ICD-10-CM | POA: Diagnosis not present

## 2015-12-15 DIAGNOSIS — G308 Other Alzheimer's disease: Secondary | ICD-10-CM | POA: Diagnosis not present

## 2015-12-15 DIAGNOSIS — G218 Other secondary parkinsonism: Secondary | ICD-10-CM | POA: Diagnosis not present

## 2015-12-16 DIAGNOSIS — G308 Other Alzheimer's disease: Secondary | ICD-10-CM | POA: Diagnosis not present

## 2015-12-16 DIAGNOSIS — R633 Feeding difficulties: Secondary | ICD-10-CM | POA: Diagnosis not present

## 2015-12-16 DIAGNOSIS — R2689 Other abnormalities of gait and mobility: Secondary | ICD-10-CM | POA: Diagnosis not present

## 2015-12-16 DIAGNOSIS — M6281 Muscle weakness (generalized): Secondary | ICD-10-CM | POA: Diagnosis not present

## 2015-12-16 DIAGNOSIS — G218 Other secondary parkinsonism: Secondary | ICD-10-CM | POA: Diagnosis not present

## 2015-12-16 DIAGNOSIS — R278 Other lack of coordination: Secondary | ICD-10-CM | POA: Diagnosis not present

## 2015-12-19 DIAGNOSIS — R278 Other lack of coordination: Secondary | ICD-10-CM | POA: Diagnosis not present

## 2015-12-19 DIAGNOSIS — G308 Other Alzheimer's disease: Secondary | ICD-10-CM | POA: Diagnosis not present

## 2015-12-19 DIAGNOSIS — R2689 Other abnormalities of gait and mobility: Secondary | ICD-10-CM | POA: Diagnosis not present

## 2015-12-19 DIAGNOSIS — M6281 Muscle weakness (generalized): Secondary | ICD-10-CM | POA: Diagnosis not present

## 2015-12-19 DIAGNOSIS — R633 Feeding difficulties: Secondary | ICD-10-CM | POA: Diagnosis not present

## 2015-12-19 DIAGNOSIS — G218 Other secondary parkinsonism: Secondary | ICD-10-CM | POA: Diagnosis not present

## 2015-12-20 DIAGNOSIS — R2689 Other abnormalities of gait and mobility: Secondary | ICD-10-CM | POA: Diagnosis not present

## 2015-12-20 DIAGNOSIS — M6281 Muscle weakness (generalized): Secondary | ICD-10-CM | POA: Diagnosis not present

## 2015-12-20 DIAGNOSIS — G218 Other secondary parkinsonism: Secondary | ICD-10-CM | POA: Diagnosis not present

## 2015-12-20 DIAGNOSIS — R633 Feeding difficulties: Secondary | ICD-10-CM | POA: Diagnosis not present

## 2015-12-20 DIAGNOSIS — R278 Other lack of coordination: Secondary | ICD-10-CM | POA: Diagnosis not present

## 2015-12-20 DIAGNOSIS — G308 Other Alzheimer's disease: Secondary | ICD-10-CM | POA: Diagnosis not present

## 2015-12-21 DIAGNOSIS — R633 Feeding difficulties: Secondary | ICD-10-CM | POA: Diagnosis not present

## 2015-12-21 DIAGNOSIS — M6281 Muscle weakness (generalized): Secondary | ICD-10-CM | POA: Diagnosis not present

## 2015-12-21 DIAGNOSIS — G218 Other secondary parkinsonism: Secondary | ICD-10-CM | POA: Diagnosis not present

## 2015-12-21 DIAGNOSIS — R2689 Other abnormalities of gait and mobility: Secondary | ICD-10-CM | POA: Diagnosis not present

## 2015-12-21 DIAGNOSIS — G308 Other Alzheimer's disease: Secondary | ICD-10-CM | POA: Diagnosis not present

## 2015-12-21 DIAGNOSIS — R278 Other lack of coordination: Secondary | ICD-10-CM | POA: Diagnosis not present

## 2015-12-23 DIAGNOSIS — G218 Other secondary parkinsonism: Secondary | ICD-10-CM | POA: Diagnosis not present

## 2015-12-23 DIAGNOSIS — G308 Other Alzheimer's disease: Secondary | ICD-10-CM | POA: Diagnosis not present

## 2015-12-23 DIAGNOSIS — R2689 Other abnormalities of gait and mobility: Secondary | ICD-10-CM | POA: Diagnosis not present

## 2015-12-23 DIAGNOSIS — R278 Other lack of coordination: Secondary | ICD-10-CM | POA: Diagnosis not present

## 2015-12-23 DIAGNOSIS — M6281 Muscle weakness (generalized): Secondary | ICD-10-CM | POA: Diagnosis not present

## 2015-12-23 DIAGNOSIS — R633 Feeding difficulties: Secondary | ICD-10-CM | POA: Diagnosis not present

## 2015-12-26 DIAGNOSIS — G218 Other secondary parkinsonism: Secondary | ICD-10-CM | POA: Diagnosis not present

## 2015-12-26 DIAGNOSIS — R278 Other lack of coordination: Secondary | ICD-10-CM | POA: Diagnosis not present

## 2015-12-26 DIAGNOSIS — R633 Feeding difficulties: Secondary | ICD-10-CM | POA: Diagnosis not present

## 2015-12-26 DIAGNOSIS — R2689 Other abnormalities of gait and mobility: Secondary | ICD-10-CM | POA: Diagnosis not present

## 2015-12-26 DIAGNOSIS — M6281 Muscle weakness (generalized): Secondary | ICD-10-CM | POA: Diagnosis not present

## 2015-12-26 DIAGNOSIS — G308 Other Alzheimer's disease: Secondary | ICD-10-CM | POA: Diagnosis not present

## 2015-12-27 ENCOUNTER — Non-Acute Institutional Stay: Payer: Medicare Other | Admitting: Internal Medicine

## 2015-12-27 DIAGNOSIS — F0281 Dementia in other diseases classified elsewhere with behavioral disturbance: Secondary | ICD-10-CM

## 2015-12-27 DIAGNOSIS — I4891 Unspecified atrial fibrillation: Secondary | ICD-10-CM | POA: Diagnosis not present

## 2015-12-27 DIAGNOSIS — F329 Major depressive disorder, single episode, unspecified: Secondary | ICD-10-CM

## 2015-12-27 DIAGNOSIS — M4806 Spinal stenosis, lumbar region: Secondary | ICD-10-CM

## 2015-12-27 DIAGNOSIS — R278 Other lack of coordination: Secondary | ICD-10-CM | POA: Diagnosis not present

## 2015-12-27 DIAGNOSIS — R5383 Other fatigue: Secondary | ICD-10-CM

## 2015-12-27 DIAGNOSIS — G309 Alzheimer's disease, unspecified: Secondary | ICD-10-CM

## 2015-12-27 DIAGNOSIS — M48061 Spinal stenosis, lumbar region without neurogenic claudication: Secondary | ICD-10-CM

## 2015-12-27 DIAGNOSIS — G308 Other Alzheimer's disease: Secondary | ICD-10-CM | POA: Diagnosis not present

## 2015-12-27 DIAGNOSIS — F32A Depression, unspecified: Secondary | ICD-10-CM

## 2015-12-27 DIAGNOSIS — R2689 Other abnormalities of gait and mobility: Secondary | ICD-10-CM | POA: Diagnosis not present

## 2015-12-27 DIAGNOSIS — R633 Feeding difficulties: Secondary | ICD-10-CM | POA: Diagnosis not present

## 2015-12-27 DIAGNOSIS — I679 Cerebrovascular disease, unspecified: Secondary | ICD-10-CM | POA: Diagnosis not present

## 2015-12-27 DIAGNOSIS — M6281 Muscle weakness (generalized): Secondary | ICD-10-CM | POA: Diagnosis not present

## 2015-12-27 DIAGNOSIS — G218 Other secondary parkinsonism: Secondary | ICD-10-CM | POA: Diagnosis not present

## 2015-12-27 NOTE — Progress Notes (Signed)
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 superior mediastinum normal. Subcentimeter hypodense nodule within the right lobe of thyroid noted, of doubtful clinical significance. No adenopathy. No acute soft tissue abnormality within the neck. CTA HEAD Anterior circulation: Petrous segments widely patent. Smooth atheromatous plaque throughout the carotid siphons with resultant mild diffuse narrowing. Plaque extends into the supraclinoid segments without significant stenosis. Left A1 segment slightly hypoplastic but widely patent. Right A1 segment patent. Anterior communicating artery normal. Anterior cerebral arteries opacified to their distal aspects. M1 segments widely patent without stenosis or occlusion. MCA bifurcations normal. No proximal M2 branch occlusion. Short short-segment moderate stenosis within a proximal left M2 branch, seen best on MIP reconstructions (series 507, image 20). MCA branches well opacified distally and symmetric. Posterior circulation: Vertebral arteries patent to the vertebrobasilar junction. Posterior inferior cerebral arteries patent. Basilar artery tortuous but widely  patent. Superior cerebellar arteries well opacified bilaterally. Both posterior cerebral arteries arise from the basilar artery. PCAs resting the basilar artery and are well opacified to their distal aspects. Mild distal small vessel changes within the distal PCAs. Venous sinuses: Patent without venous sinus thrombosis. Anatomic variants: No anatomic variant. 3 x 3 x 4  mm focal outpouching arising from the supraclinoid right ICA consistent with a small posterior communicating artery aneurysm (series 504, image 94). Delayed phase: No abnormal enhancement on delayed sequence. CT PERFUSION: No perfusion mismatch to suggest acute ischemia identified. Subtle elevated mean transit time and time to peak within the anterior left MCA distribution, like related to the previously identified proximal left M2 branch stenosis. No other perfusion abnormality. IMPRESSION: CTA NECK IMPRESSION: 1. Atheromatous plaque at the right carotid bifurcation/proximal right ICA with associated short segment stenosis of 80-90% by NASCET criteria at the origin of the right ICA. 2. Atheromatous stenosis of approximately 50% at the origin of the left ICA. 3. Widely patent vertebral arteries. CTA HEAD IMPRESSION: 1. No large or proximal arterial branch occlusion. 2. Short-segment moderate proximal left M2 stenosis as above. 3. 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 CT PERFUSION: 1. No perfusion mismatch to suggest acute ischemia. 2. 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 Critical Value/emergent results were called by telephone at the time of interpretation on 12/01/2015 at 10:49 pm to Dr. Armida Sans , who verbally acknowledged these results. Electronically Signed   By: Jeannine Boga M.D.   On: 12/01/2015 23:05    Assessment/Plan 1. Alzheimer's dementia with behavioral disturbance -is progressing -she has more periods of delusions and less clarity,  mobility had been declining (due in large part to meds) -try to reduce ativan use which only causes more confusion in these patients when anxiety is not the problem, but dementia -try to decrease pill burden by changing namenda and aricept to namzaric if insurance will cover after prior auth  2. Cerebrovascular disease -noted on her brain imaging with microvascular ischemic changes -dementia advanced at this point so would not emphasize cholesterol and bp mgt at this stage -maintain comfort and qol  3. Spinal stenosis of lumbar region -was reason for gabapentin so if pain returns, may need to reinstitute this or find an altermative for her neuropathic pain  4. Lethargy -due to depakote, seroquel, antivert, ativan, cymbalta -seroquel stopped, will reduce ativan, cont depakote and cymbalta  -if sedation remains problematic would d/c ativan altogether before stopping the depakote which may have actually helped some with mood stabilization -d/c meclizine--use PT if vertigo  5. Atrial fibrillation, unspecified type ([place]) -not on anticoagulation with h/o falls and has not required rate control agent either, no RVR   6. Depression -cont cymbalta therapy with depakote to stabilize moods, help with agitation when redirection and meeting needs do not do the trick  Functional status:  Requires help in adls except feeding  Family/ staff Communication: discussed with [employer] staff, did not have opportunity to call Alvester Chou this visit, but will catch up with him and avoid hospitalizations for his aunt  Labs/tests ordered:  No new  Shaylynn Nulty L. Ronelle Smallman, D.O. [facility] [address], [place] [address] Cell Phone (Mon-Fri 8am-5pm):  [phone] On Call:  [phone] & follow prompts after 5pm & weekends Office Phone:  [phone] Office Fax:  [phone]

## 2015-12-28 DIAGNOSIS — R633 Feeding difficulties: Secondary | ICD-10-CM | POA: Diagnosis not present

## 2015-12-28 DIAGNOSIS — N390 Urinary tract infection, site not specified: Secondary | ICD-10-CM | POA: Diagnosis not present

## 2015-12-28 DIAGNOSIS — R2681 Unsteadiness on feet: Secondary | ICD-10-CM | POA: Diagnosis not present

## 2015-12-28 DIAGNOSIS — G9349 Other encephalopathy: Secondary | ICD-10-CM | POA: Diagnosis not present

## 2015-12-28 DIAGNOSIS — R2689 Other abnormalities of gait and mobility: Secondary | ICD-10-CM | POA: Diagnosis not present

## 2015-12-28 DIAGNOSIS — R278 Other lack of coordination: Secondary | ICD-10-CM | POA: Diagnosis not present

## 2015-12-28 DIAGNOSIS — M4806 Spinal stenosis, lumbar region: Secondary | ICD-10-CM | POA: Diagnosis not present

## 2015-12-28 DIAGNOSIS — G218 Other secondary parkinsonism: Secondary | ICD-10-CM | POA: Diagnosis not present

## 2015-12-28 DIAGNOSIS — M6281 Muscle weakness (generalized): Secondary | ICD-10-CM | POA: Diagnosis not present

## 2015-12-28 DIAGNOSIS — G308 Other Alzheimer's disease: Secondary | ICD-10-CM | POA: Diagnosis not present

## 2015-12-29 DIAGNOSIS — R2689 Other abnormalities of gait and mobility: Secondary | ICD-10-CM | POA: Diagnosis not present

## 2015-12-29 DIAGNOSIS — G218 Other secondary parkinsonism: Secondary | ICD-10-CM | POA: Diagnosis not present

## 2015-12-29 DIAGNOSIS — G308 Other Alzheimer's disease: Secondary | ICD-10-CM | POA: Diagnosis not present

## 2015-12-29 DIAGNOSIS — R633 Feeding difficulties: Secondary | ICD-10-CM | POA: Diagnosis not present

## 2015-12-29 DIAGNOSIS — M6281 Muscle weakness (generalized): Secondary | ICD-10-CM | POA: Diagnosis not present

## 2015-12-29 DIAGNOSIS — R278 Other lack of coordination: Secondary | ICD-10-CM | POA: Diagnosis not present

## 2015-12-30 DIAGNOSIS — R2689 Other abnormalities of gait and mobility: Secondary | ICD-10-CM | POA: Diagnosis not present

## 2015-12-30 DIAGNOSIS — M6281 Muscle weakness (generalized): Secondary | ICD-10-CM | POA: Diagnosis not present

## 2015-12-30 DIAGNOSIS — G308 Other Alzheimer's disease: Secondary | ICD-10-CM | POA: Diagnosis not present

## 2015-12-30 DIAGNOSIS — R633 Feeding difficulties: Secondary | ICD-10-CM | POA: Diagnosis not present

## 2015-12-30 DIAGNOSIS — R278 Other lack of coordination: Secondary | ICD-10-CM | POA: Diagnosis not present

## 2015-12-30 DIAGNOSIS — G218 Other secondary parkinsonism: Secondary | ICD-10-CM | POA: Diagnosis not present

## 2016-01-02 DIAGNOSIS — R633 Feeding difficulties: Secondary | ICD-10-CM | POA: Diagnosis not present

## 2016-01-02 DIAGNOSIS — G308 Other Alzheimer's disease: Secondary | ICD-10-CM | POA: Diagnosis not present

## 2016-01-02 DIAGNOSIS — R2689 Other abnormalities of gait and mobility: Secondary | ICD-10-CM | POA: Diagnosis not present

## 2016-01-02 DIAGNOSIS — G218 Other secondary parkinsonism: Secondary | ICD-10-CM | POA: Diagnosis not present

## 2016-01-02 DIAGNOSIS — M6281 Muscle weakness (generalized): Secondary | ICD-10-CM | POA: Diagnosis not present

## 2016-01-02 DIAGNOSIS — R278 Other lack of coordination: Secondary | ICD-10-CM | POA: Diagnosis not present

## 2016-01-03 DIAGNOSIS — R278 Other lack of coordination: Secondary | ICD-10-CM | POA: Diagnosis not present

## 2016-01-03 DIAGNOSIS — G218 Other secondary parkinsonism: Secondary | ICD-10-CM | POA: Diagnosis not present

## 2016-01-03 DIAGNOSIS — M6281 Muscle weakness (generalized): Secondary | ICD-10-CM | POA: Diagnosis not present

## 2016-01-03 DIAGNOSIS — R633 Feeding difficulties: Secondary | ICD-10-CM | POA: Diagnosis not present

## 2016-01-03 DIAGNOSIS — G308 Other Alzheimer's disease: Secondary | ICD-10-CM | POA: Diagnosis not present

## 2016-01-03 DIAGNOSIS — R2689 Other abnormalities of gait and mobility: Secondary | ICD-10-CM | POA: Diagnosis not present

## 2016-01-04 DIAGNOSIS — G308 Other Alzheimer's disease: Secondary | ICD-10-CM | POA: Diagnosis not present

## 2016-01-04 DIAGNOSIS — R278 Other lack of coordination: Secondary | ICD-10-CM | POA: Diagnosis not present

## 2016-01-04 DIAGNOSIS — M6281 Muscle weakness (generalized): Secondary | ICD-10-CM | POA: Diagnosis not present

## 2016-01-04 DIAGNOSIS — R2689 Other abnormalities of gait and mobility: Secondary | ICD-10-CM | POA: Diagnosis not present

## 2016-01-04 DIAGNOSIS — R633 Feeding difficulties: Secondary | ICD-10-CM | POA: Diagnosis not present

## 2016-01-04 DIAGNOSIS — G218 Other secondary parkinsonism: Secondary | ICD-10-CM | POA: Diagnosis not present

## 2016-01-06 DIAGNOSIS — R633 Feeding difficulties: Secondary | ICD-10-CM | POA: Diagnosis not present

## 2016-01-06 DIAGNOSIS — R2689 Other abnormalities of gait and mobility: Secondary | ICD-10-CM | POA: Diagnosis not present

## 2016-01-06 DIAGNOSIS — G308 Other Alzheimer's disease: Secondary | ICD-10-CM | POA: Diagnosis not present

## 2016-01-06 DIAGNOSIS — R278 Other lack of coordination: Secondary | ICD-10-CM | POA: Diagnosis not present

## 2016-01-06 DIAGNOSIS — G218 Other secondary parkinsonism: Secondary | ICD-10-CM | POA: Diagnosis not present

## 2016-01-06 DIAGNOSIS — M6281 Muscle weakness (generalized): Secondary | ICD-10-CM | POA: Diagnosis not present

## 2016-01-08 ENCOUNTER — Encounter: Payer: Self-pay | Admitting: Internal Medicine

## 2016-01-10 DIAGNOSIS — M6281 Muscle weakness (generalized): Secondary | ICD-10-CM | POA: Diagnosis not present

## 2016-01-10 DIAGNOSIS — R278 Other lack of coordination: Secondary | ICD-10-CM | POA: Diagnosis not present

## 2016-01-10 DIAGNOSIS — R2689 Other abnormalities of gait and mobility: Secondary | ICD-10-CM | POA: Diagnosis not present

## 2016-01-10 DIAGNOSIS — R633 Feeding difficulties: Secondary | ICD-10-CM | POA: Diagnosis not present

## 2016-01-10 DIAGNOSIS — G308 Other Alzheimer's disease: Secondary | ICD-10-CM | POA: Diagnosis not present

## 2016-01-10 DIAGNOSIS — G218 Other secondary parkinsonism: Secondary | ICD-10-CM | POA: Diagnosis not present

## 2016-01-11 DIAGNOSIS — R278 Other lack of coordination: Secondary | ICD-10-CM | POA: Diagnosis not present

## 2016-01-11 DIAGNOSIS — R2689 Other abnormalities of gait and mobility: Secondary | ICD-10-CM | POA: Diagnosis not present

## 2016-01-11 DIAGNOSIS — G218 Other secondary parkinsonism: Secondary | ICD-10-CM | POA: Diagnosis not present

## 2016-01-11 DIAGNOSIS — M6281 Muscle weakness (generalized): Secondary | ICD-10-CM | POA: Diagnosis not present

## 2016-01-11 DIAGNOSIS — G308 Other Alzheimer's disease: Secondary | ICD-10-CM | POA: Diagnosis not present

## 2016-01-11 DIAGNOSIS — R633 Feeding difficulties: Secondary | ICD-10-CM | POA: Diagnosis not present

## 2016-01-13 DIAGNOSIS — G218 Other secondary parkinsonism: Secondary | ICD-10-CM | POA: Diagnosis not present

## 2016-01-13 DIAGNOSIS — M6281 Muscle weakness (generalized): Secondary | ICD-10-CM | POA: Diagnosis not present

## 2016-01-13 DIAGNOSIS — R278 Other lack of coordination: Secondary | ICD-10-CM | POA: Diagnosis not present

## 2016-01-13 DIAGNOSIS — R2689 Other abnormalities of gait and mobility: Secondary | ICD-10-CM | POA: Diagnosis not present

## 2016-01-13 DIAGNOSIS — R633 Feeding difficulties: Secondary | ICD-10-CM | POA: Diagnosis not present

## 2016-01-13 DIAGNOSIS — G308 Other Alzheimer's disease: Secondary | ICD-10-CM | POA: Diagnosis not present

## 2016-01-17 DIAGNOSIS — M6281 Muscle weakness (generalized): Secondary | ICD-10-CM | POA: Diagnosis not present

## 2016-01-17 DIAGNOSIS — R633 Feeding difficulties: Secondary | ICD-10-CM | POA: Diagnosis not present

## 2016-01-17 DIAGNOSIS — G218 Other secondary parkinsonism: Secondary | ICD-10-CM | POA: Diagnosis not present

## 2016-01-17 DIAGNOSIS — G308 Other Alzheimer's disease: Secondary | ICD-10-CM | POA: Diagnosis not present

## 2016-01-17 DIAGNOSIS — R2689 Other abnormalities of gait and mobility: Secondary | ICD-10-CM | POA: Diagnosis not present

## 2016-01-17 DIAGNOSIS — R278 Other lack of coordination: Secondary | ICD-10-CM | POA: Diagnosis not present

## 2016-01-18 DIAGNOSIS — G308 Other Alzheimer's disease: Secondary | ICD-10-CM | POA: Diagnosis not present

## 2016-01-18 DIAGNOSIS — R633 Feeding difficulties: Secondary | ICD-10-CM | POA: Diagnosis not present

## 2016-01-18 DIAGNOSIS — R2689 Other abnormalities of gait and mobility: Secondary | ICD-10-CM | POA: Diagnosis not present

## 2016-01-18 DIAGNOSIS — R278 Other lack of coordination: Secondary | ICD-10-CM | POA: Diagnosis not present

## 2016-01-18 DIAGNOSIS — M6281 Muscle weakness (generalized): Secondary | ICD-10-CM | POA: Diagnosis not present

## 2016-01-18 DIAGNOSIS — G218 Other secondary parkinsonism: Secondary | ICD-10-CM | POA: Diagnosis not present

## 2016-01-19 DIAGNOSIS — I10 Essential (primary) hypertension: Secondary | ICD-10-CM | POA: Diagnosis not present

## 2016-01-21 ENCOUNTER — Encounter: Payer: Self-pay | Admitting: Internal Medicine

## 2016-02-20 ENCOUNTER — Encounter: Payer: Self-pay | Admitting: Adult Health

## 2016-02-20 ENCOUNTER — Non-Acute Institutional Stay: Payer: Medicare Other | Admitting: Adult Health

## 2016-02-20 DIAGNOSIS — G218 Other secondary parkinsonism: Secondary | ICD-10-CM

## 2016-02-20 DIAGNOSIS — F329 Major depressive disorder, single episode, unspecified: Secondary | ICD-10-CM | POA: Diagnosis not present

## 2016-02-20 DIAGNOSIS — G308 Other Alzheimer's disease: Secondary | ICD-10-CM

## 2016-02-20 DIAGNOSIS — G309 Alzheimer's disease, unspecified: Principal | ICD-10-CM

## 2016-02-20 DIAGNOSIS — I10 Essential (primary) hypertension: Secondary | ICD-10-CM | POA: Diagnosis not present

## 2016-02-20 DIAGNOSIS — F0281 Dementia in other diseases classified elsewhere with behavioral disturbance: Secondary | ICD-10-CM

## 2016-02-20 DIAGNOSIS — I725 Aneurysm of other precerebral arteries: Secondary | ICD-10-CM | POA: Diagnosis not present

## 2016-02-20 DIAGNOSIS — I495 Sick sinus syndrome: Secondary | ICD-10-CM | POA: Diagnosis not present

## 2016-02-20 DIAGNOSIS — E559 Vitamin D deficiency, unspecified: Secondary | ICD-10-CM | POA: Diagnosis not present

## 2016-02-20 DIAGNOSIS — F32A Depression, unspecified: Secondary | ICD-10-CM

## 2016-02-20 DIAGNOSIS — F02818 Dementia in other diseases classified elsewhere, unspecified severity, with other behavioral disturbance: Secondary | ICD-10-CM

## 2016-02-20 DIAGNOSIS — I679 Cerebrovascular disease, unspecified: Secondary | ICD-10-CM

## 2016-02-20 DIAGNOSIS — R634 Abnormal weight loss: Secondary | ICD-10-CM | POA: Diagnosis not present
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
## 2016-04-07 ENCOUNTER — Encounter (HOSPITAL_COMMUNITY): Payer: Self-pay | Admitting: Emergency Medicine

## 2016-04-07 ENCOUNTER — Emergency Department (HOSPITAL_COMMUNITY): Payer: Medicare Other

## 2016-04-07 ENCOUNTER — Inpatient Hospital Stay (HOSPITAL_COMMUNITY)
Admission: EM | Admit: 2016-04-07 | Discharge: 2016-04-10 | DRG: 202 | Disposition: A | Discharge status: Nursing Home (Includes ICF) | Payer: Medicare Other | Attending: Internal Medicine | Admitting: Internal Medicine

## 2016-04-07 DIAGNOSIS — Z9049 Acquired absence of other specified parts of digestive tract: Secondary | ICD-10-CM | POA: Diagnosis not present

## 2016-04-07 DIAGNOSIS — Z79899 Other long term (current) drug therapy: Secondary | ICD-10-CM | POA: Diagnosis not present

## 2016-04-07 DIAGNOSIS — Z885 Allergy status to narcotic agent status: Secondary | ICD-10-CM

## 2016-04-07 DIAGNOSIS — G2581 Restless legs syndrome: Secondary | ICD-10-CM | POA: Diagnosis present

## 2016-04-07 DIAGNOSIS — N390 Urinary tract infection, site not specified: Secondary | ICD-10-CM | POA: Diagnosis present

## 2016-04-07 DIAGNOSIS — G309 Alzheimer's disease, unspecified: Secondary | ICD-10-CM | POA: Diagnosis present

## 2016-04-07 DIAGNOSIS — Z950 Presence of cardiac pacemaker: Secondary | ICD-10-CM | POA: Diagnosis not present

## 2016-04-07 DIAGNOSIS — G308 Other Alzheimer's disease: Secondary | ICD-10-CM | POA: Diagnosis not present

## 2016-04-07 DIAGNOSIS — Z66 Do not resuscitate: Secondary | ICD-10-CM | POA: Diagnosis present

## 2016-04-07 DIAGNOSIS — Z96643 Presence of artificial hip joint, bilateral: Secondary | ICD-10-CM | POA: Diagnosis present

## 2016-04-07 DIAGNOSIS — E7800 Pure hypercholesterolemia, unspecified: Secondary | ICD-10-CM | POA: Diagnosis present

## 2016-04-07 DIAGNOSIS — R0602 Shortness of breath: Secondary | ICD-10-CM | POA: Diagnosis not present

## 2016-04-07 DIAGNOSIS — Z886 Allergy status to analgesic agent status: Secondary | ICD-10-CM

## 2016-04-07 DIAGNOSIS — Z87891 Personal history of nicotine dependence: Secondary | ICD-10-CM | POA: Diagnosis not present

## 2016-04-07 DIAGNOSIS — I495 Sick sinus syndrome: Secondary | ICD-10-CM | POA: Diagnosis present

## 2016-04-07 DIAGNOSIS — J209 Acute bronchitis, unspecified: Secondary | ICD-10-CM | POA: Diagnosis present

## 2016-04-07 DIAGNOSIS — J40 Bronchitis, not specified as acute or chronic: Secondary | ICD-10-CM | POA: Diagnosis not present

## 2016-04-07 DIAGNOSIS — F0281 Dementia in other diseases classified elsewhere with behavioral disturbance: Secondary | ICD-10-CM | POA: Diagnosis present

## 2016-04-07 DIAGNOSIS — I10 Essential (primary) hypertension: Secondary | ICD-10-CM | POA: Diagnosis not present

## 2016-04-07 DIAGNOSIS — R4182 Altered mental status, unspecified: Secondary | ICD-10-CM | POA: Diagnosis not present

## 2016-04-07 DIAGNOSIS — J22 Unspecified acute lower respiratory infection: Secondary | ICD-10-CM

## 2016-04-07 DIAGNOSIS — J189 Pneumonia, unspecified organism: Secondary | ICD-10-CM | POA: Diagnosis present

## 2016-04-07 DIAGNOSIS — E785 Hyperlipidemia, unspecified: Secondary | ICD-10-CM | POA: Diagnosis present

## 2016-04-07 DIAGNOSIS — E860 Dehydration: Secondary | ICD-10-CM | POA: Diagnosis present

## 2016-04-07 DIAGNOSIS — R05 Cough: Secondary | ICD-10-CM

## 2016-04-07 DIAGNOSIS — R0902 Hypoxemia: Secondary | ICD-10-CM

## 2016-04-07 DIAGNOSIS — G4733 Obstructive sleep apnea (adult) (pediatric): Secondary | ICD-10-CM | POA: Diagnosis present

## 2016-04-07 DIAGNOSIS — Z90710 Acquired absence of both cervix and uterus: Secondary | ICD-10-CM

## 2016-04-07 DIAGNOSIS — Z853 Personal history of malignant neoplasm of breast: Secondary | ICD-10-CM

## 2016-04-07 DIAGNOSIS — F02818 Dementia in other diseases classified elsewhere, unspecified severity, with other behavioral disturbance: Secondary | ICD-10-CM | POA: Diagnosis present

## 2016-04-07 DIAGNOSIS — R627 Adult failure to thrive: Secondary | ICD-10-CM | POA: Diagnosis present

## 2016-04-07 DIAGNOSIS — G9341 Metabolic encephalopathy: Secondary | ICD-10-CM | POA: Diagnosis present

## 2016-04-07 DIAGNOSIS — J988 Other specified respiratory disorders: Secondary | ICD-10-CM | POA: Diagnosis not present

## 2016-04-07 DIAGNOSIS — R059 Cough, unspecified: Secondary | ICD-10-CM

## 2016-04-07 LAB — COMPREHENSIVE METABOLIC PANEL
ALK PHOS: 81 U/L (ref 38–126)
ALT: 12 U/L — AB (ref 14–54)
AST: 18 U/L (ref 15–41)
Albumin: 3.6 g/dL (ref 3.5–5.0)
Anion gap: 12 (ref 5–15)
BILIRUBIN TOTAL: 0.9 mg/dL (ref 0.3–1.2)
BUN: 30 mg/dL — ABNORMAL HIGH (ref 6–20)
CALCIUM: 9.5 mg/dL (ref 8.9–10.3)
CHLORIDE: 102 mmol/L (ref 101–111)
CO2: 29 mmol/L (ref 22–32)
CREATININE: 0.71 mg/dL (ref 0.44–1.00)
Glucose, Bld: 113 mg/dL — ABNORMAL HIGH (ref 65–99)
Potassium: 3.2 mmol/L — ABNORMAL LOW (ref 3.5–5.1)
Sodium: 143 mmol/L (ref 135–145)
TOTAL PROTEIN: 6.4 g/dL — AB (ref 6.5–8.1)

## 2016-04-07 LAB — INFLUENZA PANEL BY PCR (TYPE A & B)
H1N1 flu by pcr: NOT DETECTED
Influenza A By PCR: NEGATIVE
Influenza B By PCR: NEGATIVE

## 2016-04-07 LAB — URINE MICROSCOPIC-ADD ON: RBC / HPF: NONE SEEN RBC/hpf (ref 0–5)

## 2016-04-07 LAB — URINALYSIS, ROUTINE W REFLEX MICROSCOPIC
Bilirubin Urine: NEGATIVE
Glucose, UA: NEGATIVE mg/dL
Hgb urine dipstick: NEGATIVE
KETONES UR: NEGATIVE mg/dL
NITRITE: NEGATIVE
PH: 6 (ref 5.0–8.0)
PROTEIN: NEGATIVE mg/dL
Specific Gravity, Urine: 1.017 (ref 1.005–1.030)

## 2016-04-07 LAB — CBC WITH DIFFERENTIAL/PLATELET
BASOS ABS: 0 10*3/uL (ref 0.0–0.1)
Basophils Relative: 0 %
EOS PCT: 4 %
Eosinophils Absolute: 0.5 10*3/uL (ref 0.0–0.7)
HCT: 41.7 % (ref 36.0–46.0)
Hemoglobin: 13.8 g/dL (ref 12.0–15.0)
LYMPHS PCT: 23 %
Lymphs Abs: 2.7 10*3/uL (ref 0.7–4.0)
MCH: 32 pg (ref 26.0–34.0)
MCHC: 33.1 g/dL (ref 30.0–36.0)
MCV: 96.8 fL (ref 78.0–100.0)
Monocytes Absolute: 0.8 10*3/uL (ref 0.1–1.0)
Monocytes Relative: 6 %
NEUTROS PCT: 67 %
Neutro Abs: 8 10*3/uL — ABNORMAL HIGH (ref 1.7–7.7)
PLATELETS: 157 10*3/uL (ref 150–400)
RBC: 4.31 MIL/uL (ref 3.87–5.11)
RDW: 13.3 % (ref 11.5–15.5)
WBC: 12 10*3/uL — AB (ref 4.0–10.5)

## 2016-04-07 LAB — MRSA PCR SCREENING: MRSA BY PCR: NEGATIVE

## 2016-04-07 LAB — I-STAT CG4 LACTIC ACID, ED: LACTIC ACID, VENOUS: 1.04 mmol/L (ref 0.5–2.0)

## 2016-04-07 LAB — STREP PNEUMONIAE URINARY ANTIGEN: Strep Pneumo Urinary Antigen: NEGATIVE

## 2016-04-07 LAB — TROPONIN I: Troponin I: <0.03 ng/mL (ref <0.031)

## 2016-04-07 LAB — BRAIN NATRIURETIC PEPTIDE: B Natriuretic Peptide: 52.8 pg/mL (ref 0.0–100.0)

## 2016-04-07 MED ORDER — DONEPEZIL HCL 10 MG PO TABS
10.0000 mg | ORAL_TABLET | Freq: Every day | ORAL | Status: DC
  Administered 2016-04-07 – 2016-04-10 (×4): 10 mg via ORAL
  Filled 2016-04-07 (×4): qty 1

## 2016-04-07 MED ORDER — COLESEVELAM HCL 625 MG PO TABS
625.0000 mg | ORAL_TABLET | Freq: Every day | ORAL | Status: DC
  Administered 2016-04-08 – 2016-04-10 (×3): 625 mg via ORAL
  Filled 2016-04-07 (×4): qty 1

## 2016-04-07 MED ORDER — MONTELUKAST SODIUM 10 MG PO TABS
10.0000 mg | ORAL_TABLET | Freq: Every day | ORAL | Status: DC
  Administered 2016-04-07 – 2016-04-09 (×3): 10 mg via ORAL
  Filled 2016-04-07 (×5): qty 1

## 2016-04-07 MED ORDER — LOSARTAN POTASSIUM 50 MG PO TABS
100.0000 mg | ORAL_TABLET | Freq: Every day | ORAL | Status: DC
  Administered 2016-04-07 – 2016-04-10 (×4): 100 mg via ORAL
  Filled 2016-04-07 (×4): qty 2

## 2016-04-07 MED ORDER — AMLODIPINE BESYLATE 5 MG PO TABS
5.0000 mg | ORAL_TABLET | Freq: Every day | ORAL | Status: DC
  Administered 2016-04-07 – 2016-04-10 (×4): 5 mg via ORAL
  Filled 2016-04-07 (×4): qty 1

## 2016-04-07 MED ORDER — LEVOFLOXACIN IN D5W 750 MG/150ML IV SOLN
750.0000 mg | Freq: Once | INTRAVENOUS | Status: AC
  Administered 2016-04-07: 750 mg via INTRAVENOUS
  Filled 2016-04-07: qty 150

## 2016-04-07 MED ORDER — DULOXETINE HCL 60 MG PO CPEP
60.0000 mg | ORAL_CAPSULE | Freq: Every day | ORAL | Status: DC
  Administered 2016-04-07 – 2016-04-10 (×4): 60 mg via ORAL
  Filled 2016-04-07 (×4): qty 1

## 2016-04-07 MED ORDER — MEMANTINE HCL ER 28 MG PO CP24
28.0000 mg | ORAL_CAPSULE | ORAL | Status: DC
  Administered 2016-04-08 – 2016-04-10 (×3): 28 mg via ORAL
  Filled 2016-04-07 (×4): qty 1

## 2016-04-07 MED ORDER — DEXTROSE 5 % IV SOLN
1.0000 g | INTRAVENOUS | Status: DC
  Administered 2016-04-08 – 2016-04-10 (×3): 1 g via INTRAVENOUS
  Filled 2016-04-07 (×3): qty 10

## 2016-04-07 MED ORDER — AMANTADINE HCL 100 MG PO CAPS
100.0000 mg | ORAL_CAPSULE | Freq: Every day | ORAL | Status: DC
  Administered 2016-04-07 – 2016-04-08 (×2): 100 mg via ORAL
  Filled 2016-04-07 (×2): qty 1

## 2016-04-07 MED ORDER — ENOXAPARIN SODIUM 40 MG/0.4ML ~~LOC~~ SOLN
40.0000 mg | SUBCUTANEOUS | Status: DC
  Administered 2016-04-07 – 2016-04-09 (×3): 40 mg via SUBCUTANEOUS
  Filled 2016-04-07 (×4): qty 0.4

## 2016-04-07 MED ORDER — POTASSIUM CHLORIDE CRYS ER 20 MEQ PO TBCR
40.0000 meq | EXTENDED_RELEASE_TABLET | Freq: Once | ORAL | Status: AC
  Administered 2016-04-07: 40 meq via ORAL
  Filled 2016-04-07: qty 2

## 2016-04-07 MED ORDER — SODIUM CHLORIDE 0.9 % IV BOLUS (SEPSIS)
1000.0000 mL | Freq: Once | INTRAVENOUS | Status: AC
  Administered 2016-04-07: 1000 mL via INTRAVENOUS

## 2016-04-07 MED ORDER — IPRATROPIUM-ALBUTEROL 0.5-2.5 (3) MG/3ML IN SOLN
3.0000 mL | Freq: Once | RESPIRATORY_TRACT | Status: AC
  Administered 2016-04-07: 3 mL via RESPIRATORY_TRACT
  Filled 2016-04-07: qty 3

## 2016-04-07 MED ORDER — METOPROLOL SUCCINATE ER 25 MG PO TB24
25.0000 mg | ORAL_TABLET | Freq: Every day | ORAL | Status: DC
  Administered 2016-04-07 – 2016-04-10 (×4): 25 mg via ORAL
  Filled 2016-04-07 (×4): qty 1

## 2016-04-07 NOTE — ED Notes (Signed)
Bed: [id number] Expected date: 04/07/16 Expected time: 9:26 AM Means of arrival: Ambulance Comments: Lethargic

## 2016-04-07 NOTE — ED Notes (Addendum)
Pt from [facility] via EMS- Per facility, pt has had dry cough x7 days. Staff sts that pt has been more lethargic than normal. Pt is A&O per her daughter at baseline -pt has alzheimer's.  Pt in NAD
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
## 2016-04-07 NOTE — ED Notes (Signed)
Will transport pt @ 1415 when RN is back on floor

## 2016-04-08 ENCOUNTER — Inpatient Hospital Stay (HOSPITAL_COMMUNITY): Payer: Medicare Other

## 2016-04-08 DIAGNOSIS — J988 Other specified respiratory disorders: Secondary | ICD-10-CM

## 2016-04-08 DIAGNOSIS — J22 Unspecified acute lower respiratory infection: Secondary | ICD-10-CM | POA: Insufficient documentation

## 2016-04-08 LAB — CBC
HCT: 40.8 % (ref 36.0–46.0)
Hemoglobin: 13.5 g/dL (ref 12.0–15.0)
MCH: 31.8 pg (ref 26.0–34.0)
MCHC: 33.1 g/dL (ref 30.0–36.0)
MCV: 96.2 fL (ref 78.0–100.0)
PLATELETS: 148 10*3/uL — AB (ref 150–400)
RBC: 4.24 MIL/uL (ref 3.87–5.11)
RDW: 13.1 % (ref 11.5–15.5)
WBC: 9.6 10*3/uL (ref 4.0–10.5)

## 2016-04-08 LAB — BASIC METABOLIC PANEL
Anion gap: 9 (ref 5–15)
BUN: 20 mg/dL (ref 6–20)
CHLORIDE: 104 mmol/L (ref 101–111)
CO2: 27 mmol/L (ref 22–32)
CREATININE: 0.57 mg/dL (ref 0.44–1.00)
Calcium: 9.1 mg/dL (ref 8.9–10.3)
GFR calc non Af Amer: >60 mL/min (ref >60)
Glucose, Bld: 105 mg/dL — ABNORMAL HIGH (ref 65–99)
Potassium: 3.6 mmol/L (ref 3.5–5.1)
SODIUM: 140 mmol/L (ref 135–145)

## 2016-04-08 LAB — HIV ANTIBODY (ROUTINE TESTING W REFLEX): HIV SCREEN 4TH GENERATION: NONREACTIVE

## 2016-04-08 NOTE — Evaluation (Signed)
Physical Therapy Evaluation Patient Details Name: Sydney Arroyo MRN: [id number] DOB: 02-17-1931 Today's Date: 04/08/2016   History of Present Illness  Sydney Arroyo is a 82 y.o. female with medical history significant of advanced dementia, hypertension, dyslipidemia, status post pacemaker insertion in 2009, current resident at [facility] who was transferred to the emergency department 04/07/16 for further evaluation of functional decline and cough.. found to have UTI and question CAP.  Clinical Impression  The patient  Is pleasantly confused. Mobilized to  Sit and standing with mod assistance. No family present to indicate prior level of function. Patient  Is from [facility] /ALF. Pt admitted with above diagnosis. Pt currently with functional limitations due to the deficits listed below (see PT Problem List).  Pt will benefit from skilled PT to increase their independence and safety with mobility to allow discharge to the venue listed below.       Follow Up Recommendations  (return to ALF if near baseline which  is not known at this time)    Equipment Recommendations  None recommended by PT    Recommendations for Other Services       Precautions / Restrictions Precautions Precautions: Fall Precaution Comments: incontinent      Mobility  Bed Mobility Overal bed mobility: Needs Assistance Bed Mobility: Supine to Sit;Sit to Supine     Supine to sit: Mod assist;HOB elevated Sit to supine: Mod assist   General bed mobility comments: multimodal cues,   Transfers Overall transfer level: Needs assistance Equipment used: Rolling walker (2 wheeled) Transfers: Sit to/from Stand Sit to Stand: Mod assist;+2 safety/equipment         General transfer comment: multimodal cues, attempted to side step but  patient not following directions, strong tactile cues to side step.  Ambulation/Gait                Stairs            Wheelchair Mobility    Modified Rankin  (Stroke Patients Only)       Balance Overall balance assessment: Needs assistance Sitting-balance support: Feet supported;Bilateral upper extremity supported Sitting balance-Leahy Scale: Fair     Standing balance support: During functional activity;Bilateral upper extremity supported Standing balance-Leahy Scale: Poor                               Pertinent Vitals/Pain Pain Assessment: Faces Faces Pain Scale: No hurt    Home Living Family/patient expects to be discharged to:: Assisted living                 Additional Comments: pt from [facility]  2 Center Unit    Prior Function           Comments: Unknown.     Hand Dominance        Extremity/Trunk Assessment   Upper Extremity Assessment: Generalized weakness           Lower Extremity Assessment: Generalized weakness      Cervical / Trunk Assessment: Kyphotic  Communication   Communication:  (is talking but makes no sense)  Cognition Arousal/Alertness: Awake/alert Behavior During Therapy: WFL for tasks assessed/performed Overall Cognitive Status: No family/caregiver present to determine baseline cognitive functioning Area of Impairment: Attention;Following commands       Following Commands: Follows one step commands inconsistently            General Comments      Exercises  Assessment/Plan    PT Assessment Patient needs continued PT services  PT Diagnosis Difficulty walking;Generalized weakness;Altered mental status   PT Problem List Decreased activity tolerance;Decreased balance;Decreased mobility;Decreased cognition  PT Treatment Interventions DME instruction;Gait training;Functional mobility training;Therapeutic activities;Therapeutic exercise;Patient/family education   PT Goals (Current goals can be found in the Care Plan section) Acute Rehab PT Goals PT Goal Formulation: Patient unable to participate in goal setting Time For Goal Achievement:  04/22/16 Potential to Achieve Goals: Fair    Frequency Min 2X/week   Barriers to discharge        Co-evaluation               End of Session   Activity Tolerance: Patient tolerated treatment well Patient left: in bed;with call bell/phone within reach;with bed alarm set Nurse Communication: Mobility status         Time: [id number] PT Time Calculation (min) (ACUTE ONLY): 12 min   Charges:   PT Evaluation $PT Eval Low Complexity: 1 Procedure     PT G CodesMarcelino Freestone PT [id number]  04/08/2016, 12:55 PM

## 2016-04-08 NOTE — Progress Notes (Signed)
PROGRESS NOTE  Sydney Arroyo [id number] DOB: May 19, 1931 DOA: 04/07/2016 PCP: Hollace Kinnier, DO  Brief History:  82 y.o. female with medical history significant of advanced dementia, hypertension, dyslipidemia, status post pacemaker insertion in 2009, current resident at [facility] who was transferred to the emergency department for further evaluation of functional decline and cough. Sydney Arroyo is unable to provide history or participate in her on plan of care given advanced dementia. Staff members have noticed her to have increasing cough, congestion, shortness of breath over the past week becoming lethargic, less interactive, not acting at her baseline self.  Assessment/Plan: Acute metabolic encephalopathy -Likely due to viral respiratory infection, dehydration, and possible UTI -At the time of her last discharge on 12/06/2015, pt was awake and alert, oriented only to self, which was presumed to be her baseline -Discontinue amantadine as this can cause increasing confusion -12/01/2015 serum B12 695 -12/01/2015 TSH 2.212 -12/01/2015 ammonia 25 -04/08/16--more awake and conversant, pleasantly confused  Alzheimer Dementia with behavior disturbance  -Continue Namenda and Aricept -Minimize opioids and hypnotics  Failure to thrive/functional decline -Continue physical therapy -Serum B12 -Nutritional consult -Recheck TSH  Pyuria -Unfortunately, urine culture was not sent -Continue ceftriaxone, short course  Acute bronchitis -influenza pcr neg -check viral respiratory panel  Hypertension -Continue amlodipine, metoprolol succinate, and losartan -Blood pressure controlled  Sick sinus syndrome -Status post permanent pacemaker -Monitor on telemetry   Disposition Plan:   SNF in  1-2 days  Family Communication:   No Family at beside  Consultants:  None  Code Status:  DNR   Subjective: Patient is presently confused. She is alert and oriented 1. Denies any  chest pain, shortness breath, abdominal pain. Remainder review of systems unobtainable secondary to patient's encephalopathy.  Objective: Filed Vitals:   04/07/16 1408 04/07/16 1438 04/07/16 2132 04/08/16 0458  BP: 130/54 138/68 131/66 127/51  Pulse: 88 85 79 68  Temp:  99.1 F (37.3 C) 98.1 F (36.7 C) 97.9 F (36.6 C)  TempSrc:  Oral Oral Oral  Resp: 17 16 18 18   Weight:  72.5 kg (159 lb 13.3 oz)    SpO2: 98% 100% 99% 98%    Intake/Output Summary (Last 24 hours) at 04/08/16 1229 Last data filed at 04/08/16 0853  Gross per 24 hour  Intake    350 ml  Output      0 ml  Net    350 ml   Weight change:  Exam:   General:  Pt is alert, follows commands appropriately, not in acute distress  HEENT: No icterus, No thrush, No neck mass, [place]/AT; no meningismus  Cardiovascular: RRR, S1/S2, no rubs, no gallops  Respiratory: Bibasilar rales, left greater than right. Mild basilar wheeze. Good air movement.  Abdomen: Soft/+BS, non tender, non distended, no guarding  Extremities: No edema, No lymphangitis, No petechiae, No rashes, no synovitis   Data Reviewed: I have personally reviewed following labs and imaging studies Basic Metabolic Panel:  Recent Labs Lab 04/07/16 1054 04/08/16 0550  NA 143 140  K 3.2* 3.6  CL 102 104  CO2 29 27  GLUCOSE 113* 105*  BUN 30* 20  CREATININE 0.71 0.57  CALCIUM 9.5 9.1   Liver Function Tests:  Recent Labs Lab 04/07/16 1054  AST 18  ALT 12*  ALKPHOS 81  BILITOT 0.9  PROT 6.4*  ALBUMIN 3.6   No results for input(s): LIPASE, AMYLASE in the last 168 hours. No results for input(s): AMMONIA  in the last 168 hours. Coagulation Profile: No results for input(s): INR, PROTIME in the last 168 hours. CBC:  Recent Labs Lab 04/07/16 1054 04/08/16 0550  WBC 12.0* 9.6  NEUTROABS 8.0*  --   HGB 13.8 13.5  HCT 41.7 40.8  MCV 96.8 96.2  PLT 157 148*   Cardiac Enzymes:  Recent Labs Lab 04/07/16 1054  TROPONINI <0.03    BNP: Invalid input(s): POCBNP CBG: No results for input(s): GLUCAP in the last 168 hours. HbA1C: No results for input(s): HGBA1C in the last 72 hours. Urine analysis:    Component Value Date/Time   COLORURINE YELLOW 04/07/2016 1102   APPEARANCEUR CLOUDY* 04/07/2016 1102   LABSPEC 1.017 04/07/2016 1102   PHURINE 6.0 04/07/2016 1102   GLUCOSEU NEGATIVE 04/07/2016 1102   HGBUR NEGATIVE 04/07/2016 [place] 04/07/2016 1102   [place] 04/07/2016 1102   PROTEINUR NEGATIVE 04/07/2016 1102   NITRITE NEGATIVE 04/07/2016 1102   LEUKOCYTESUR MODERATE* 04/07/2016 1102   Sepsis Labs: @LABRCNTIP (procalcitonin:4,lacticidven:4) ) Recent Results (from the past 240 hour(s))  MRSA PCR Screening     Status: None   Collection Time: 04/07/16  3:25 PM  Result Value Ref Range Status   MRSA by PCR NEGATIVE NEGATIVE Final    Comment:        The GeneXpert MRSA Assay (FDA approved for NASAL specimens only), is one component of a comprehensive MRSA colonization surveillance program. It is not intended to diagnose MRSA infection nor to guide or monitor treatment for MRSA infections.   Culture, blood (Routine X 2) w Reflex to ID Panel     Status: None (Preliminary result)   Collection Time: 04/07/16  3:29 PM  Result Value Ref Range Status   Specimen Description BLOOD LEFT [facility]  Final   Special Requests IN PEDIATRIC BOTTLE [place]  Final   Culture   Final    NO GROWTH < 24 HOURS Performed at [facility]    Report Status PENDING  Incomplete  Culture, blood (Routine X 2) w Reflex to ID Panel     Status: None (Preliminary result)   Collection Time: 04/07/16  3:29 PM  Result Value Ref Range Status   Specimen Description BLOOD LEFT HAND  Final   Special Requests IN PEDIATRIC BOTTLE [place]  Final   Culture   Final    NO GROWTH < 24 HOURS Performed at [facility]    Report Status PENDING  Incomplete     Scheduled Meds: . amantadine  100 mg Oral Daily  .  amLODipine  5 mg Oral Daily  . cefTRIAXone (ROCEPHIN)  IV  1 g Intravenous Q24H  . colesevelam  625 mg Oral QAC breakfast  . donepezil  10 mg Oral Daily  . DULoxetine  60 mg Oral Daily  . enoxaparin (LOVENOX) injection  40 mg Subcutaneous Q24H  . losartan  100 mg Oral Daily  . memantine  28 mg Oral BH-q7a  . metoprolol succinate  25 mg Oral Daily  . montelukast  10 mg Oral QHS   Continuous Infusions:   Procedures/Studies: Dg Chest Port 1 View  04/08/2016  CLINICAL DATA:  Healthcare associated pneumonia EXAM: PORTABLE CHEST 1 VIEW COMPARISON:  04/07/2016 FINDINGS: Cardiomediastinal silhouette is stable. Mild elevation of the right hemidiaphragm. Dual lead cardiac pacemaker is unchanged in position. Mild interstitial prominence bilateral without convincing pulmonary edema. No acute infiltrate. IMPRESSION: Dual lead cardiac pacemaker is unchanged in position. Minimal interstitial prominence bilaterally without pulmonary edema. No segmental infiltrate.  Electronically Signed   By: Lahoma Crocker M.D.   On: 04/08/2016 08:59   Dg Chest Port 1 View  04/07/2016  CLINICAL DATA:  Dry cough 7 days.  Lethargy. EXAM: PORTABLE CHEST 1 VIEW COMPARISON:  12/19/2012 FINDINGS: Left-sided pacemaker unchanged. Lungs are adequately inflated without consolidation or effusion. Minimal prominence of the perihilar markings likely mild vascular congestion. Cardiomediastinal silhouette is within normal. There is calcified plaque over the aortic arch. Remainder of the exam is unchanged. IMPRESSION: Suggestion of minimal vascular congestion. Electronically Signed   By: Marin Olp M.D.   On: 04/07/2016 10:55    Elisavet Buehrer, DO  [facility] Pager [phone]  If 7PM-7AM, please contact night-coverage [email or web address] Password [email or web address] 04/08/2016, 12:29 PM   LOS: 1 day

## 2016-04-09 LAB — RESPIRATORY PANEL BY PCR
Adenovirus: NOT DETECTED
Bordetella pertussis: NOT DETECTED
CORONAVIRUS HKU1-RVPPCR: NOT DETECTED
CORONAVIRUS OC43-RVPPCR: NOT DETECTED
Chlamydophila pneumoniae: NOT DETECTED
Coronavirus 229E: NOT DETECTED
Coronavirus NL63: NOT DETECTED
INFLUENZA A H1 2009-RVPPR: NOT DETECTED
INFLUENZA A H3-RVPPCR: NOT DETECTED
Influenza A H1: NOT DETECTED
Influenza A: NOT DETECTED
Influenza B: NOT DETECTED
METAPNEUMOVIRUS-RVPPCR: NOT DETECTED
MYCOPLASMA PNEUMONIAE-RVPPCR: NOT DETECTED
PARAINFLUENZA VIRUS 1-RVPPCR: NOT DETECTED
PARAINFLUENZA VIRUS 2-RVPPCR: NOT DETECTED
PARAINFLUENZA VIRUS 3-RVPPCR: NOT DETECTED
Parainfluenza Virus 4: NOT DETECTED
Respiratory Syncytial Virus: NOT DETECTED
Rhinovirus / Enterovirus: NOT DETECTED

## 2016-04-09 LAB — TSH: TSH: 2.211 u[IU]/mL (ref 0.350–4.500)

## 2016-04-09 LAB — BASIC METABOLIC PANEL
Anion gap: 8 (ref 5–15)
BUN: 15 mg/dL (ref 6–20)
CHLORIDE: 101 mmol/L (ref 101–111)
CO2: 29 mmol/L (ref 22–32)
Calcium: 9.1 mg/dL (ref 8.9–10.3)
Creatinine, Ser: 0.5 mg/dL (ref 0.44–1.00)
GFR calc Af Amer: >60 mL/min (ref >60)
GFR calc non Af Amer: >60 mL/min (ref >60)
GLUCOSE: 121 mg/dL — AB (ref 65–99)
POTASSIUM: 3.4 mmol/L — AB (ref 3.5–5.1)
Sodium: 138 mmol/L (ref 135–145)

## 2016-04-09 LAB — CBC
HEMATOCRIT: 41.6 % (ref 36.0–46.0)
HEMOGLOBIN: 13.9 g/dL (ref 12.0–15.0)
MCH: 31.9 pg (ref 26.0–34.0)
MCHC: 33.4 g/dL (ref 30.0–36.0)
MCV: 95.4 fL (ref 78.0–100.0)
PLATELETS: 158 10*3/uL (ref 150–400)
RBC: 4.36 MIL/uL (ref 3.87–5.11)
RDW: 13 % (ref 11.5–15.5)
WBC: 9.6 10*3/uL (ref 4.0–10.5)

## 2016-04-09 LAB — VITAMIN B12: Vitamin B-12: 469 pg/mL (ref 180–914)

## 2016-04-09 MED ORDER — POTASSIUM CHLORIDE IN NACL 20-0.9 MEQ/L-% IV SOLN
INTRAVENOUS | Status: DC
  Administered 2016-04-09 (×2): via INTRAVENOUS
  Filled 2016-04-09 (×3): qty 1000

## 2016-04-09 MED ORDER — POTASSIUM CHLORIDE CRYS ER 20 MEQ PO TBCR
20.0000 meq | EXTENDED_RELEASE_TABLET | Freq: Once | ORAL | Status: AC
  Administered 2016-04-09: 20 meq via ORAL
  Filled 2016-04-09: qty 1

## 2016-04-09 MED ORDER — SODIUM CHLORIDE 0.9 % IV SOLN
INTRAVENOUS | Status: DC
  Filled 2016-04-09: qty 1000

## 2016-04-09 MED ORDER — LOSARTAN POTASSIUM 100 MG PO TABS
100.0000 mg | ORAL_TABLET | Freq: Every day | ORAL | Status: DC
Start: 2016-04-09 — End: 2017-01-23

## 2016-04-09 NOTE — Discharge Summary (Signed)
Physician Discharge Summary  Sydney Arroyo [id number] DOB: 15-Nov-1931 DOA: 04/07/2016  PCP: Sydney Kinnier, DO  Admit date: 04/07/2016 Discharge date: 04/10/2016  Recommendations for Outpatient Follow-up:  1. Pt will need to follow up with PCP in 2 weeks post discharge 2. Please obtain BMP and CBC 1-2 weeks  Discharge Diagnoses:  Acute metabolic encephalopathy -Likely due to viral respiratory infection, dehydration, and possible UTI -At the time of her last discharge on 12/06/2015, pt was awake and alert, oriented only to self, which was presumed to be her baseline -Discontinue amantadine as this can cause increasing confusion -12/01/2015 serum B12 695 -12/01/2015 TSH 2.212 -12/01/2015 ammonia 25 -04/10/16--awake and conversant, pleasantly confused, A&Ox1  Alzheimer Dementia with behavior disturbance  -Continue Namenda and Aricept -Minimize opioids and hypnotics  Failure to thrive/functional decline -Continue physical therapy -Serum B12--469 -Recheck TSH--2.211  Pyuria -Unfortunately, urine culture was not sent -Continue ceftriaxone, short course--plan last dose on 04/10/16--had 3 days  Acute bronchitis -influenza pcr neg -check viral respiratory panel--neg -blood cultures neg at time of d/c -stable on RA  Hypertension -Continue amlodipine, metoprolol succinate, and losartan -Blood pressure controlled -d/c HCTZ  Sick sinus syndrome -Status post permanent pacemaker -Monitor on telemetry -No concerning dysrhythmias  Discharge Condition: Stable  Disposition:  [facility]  Diet:regular Wt Readings from Last 3 Encounters:  04/09/16 73 kg (160 lb 15 oz)  02/20/16 63.957 kg (141 lb)  12/27/15 74.39 kg (164 lb)    History of present illness:  82 y.o. female with medical history significant of advanced dementia, hypertension, dyslipidemia, status post pacemaker insertion in 2009, current resident at [facility] who was transferred to the emergency department  for further evaluation of functional decline and cough. Sydney Arroyo is unable to provide history or participate in her on plan of care given advanced dementia. Staff members have noticed her to have increasing cough, congestion, shortness of breath over the past week becoming lethargic, less interactive, not acting at her baseline self. The patient was started on intravenous fluids and empiric antibiotics. She became more alert and less lethargic. She was conversant and interactive although pleasantly confused. The patient finished 3 days of intravenous antibiotics during the hospitalization. Physical therapy was consulted and recommended return to [facility].  Discharge Exam: Filed Vitals:   04/10/16 0226 04/10/16 0635  BP: 144/65 148/64  Pulse: 70 67  Temp: 98 F (36.7 C) 97.5 F (36.4 C)  Resp: 16 18   Filed Vitals:   04/09/16 2203 04/09/16 2300 04/10/16 0226 04/10/16 0635  BP: 128/68  144/65 148/64  Pulse: 75  70 67  Temp: 98.1 F (36.7 C)  98 F (36.7 C) 97.5 F (36.4 C)  TempSrc: Oral  Oral Oral  Resp: 18  16 18   Height:  5\' 2"  (1.575 m)    Weight:  73 kg (160 lb 15 oz)    SpO2: 93%  100% 96%   General: awake and alert, NAD, pleasant, cooperative Cardiovascular: RRR, no rub, no gallop, no S3 Respiratory: Bibasilar crackles. No wheeze. Good air movement Abdomen:soft, nontender, nondistended, positive bowel sounds Extremities: No edema, No lymphangitis, no petechiae  Discharge Instructions     Medication List    STOP taking these medications        amantadine 100 MG capsule  Commonly known as:  SYMMETREL     losartan-hydrochlorothiazide 100-25 MG tablet  Commonly known as:  HYZAAR      TAKE these medications        acetaminophen 500 MG tablet  Commonly  known as:  TYLENOL  Take 1,000 mg by mouth every 8 (eight) hours as needed for mild pain.     amLODipine 5 MG tablet  Commonly known as:  NORVASC  Take 5 mg by mouth daily.     colesevelam 625 MG tablet    Commonly known as:  WELCHOL  Take 625 mg by mouth daily. For cholesterol     docusate sodium 100 MG capsule  Commonly known as:  COLACE  Take 100 mg by mouth 2 (two) times daily.     donepezil 10 MG tablet  Commonly known as:  ARICEPT  Take 10 mg by mouth daily.     DULoxetine 60 MG capsule  Commonly known as:  CYMBALTA  Take 1 capsule (60 mg total) by mouth daily.     losartan 100 MG tablet  Commonly known as:  COZAAR  Take 1 tablet (100 mg total) by mouth daily.     metoprolol succinate 25 MG 24 hr tablet  Commonly known as:  TOPROL-XL  Take 25 mg by mouth daily.     montelukast 10 MG tablet  Commonly known as:  SINGULAIR  Take 10 mg by mouth at bedtime.     NAMENDA XR 28 MG Cp24 24 hr capsule  Generic drug:  memantine  Take 28 mg by mouth every morning. For memory     Vitamin D3 2000 units Tabs  Take 1 tablet by mouth daily.         The results of significant diagnostics from this hospitalization (including imaging, microbiology, ancillary and laboratory) are listed below for reference.    Significant Diagnostic Studies: Dg Chest Port 1 View  04/08/2016  CLINICAL DATA:  Healthcare associated pneumonia EXAM: PORTABLE CHEST 1 VIEW COMPARISON:  04/07/2016 FINDINGS: Cardiomediastinal silhouette is stable. Mild elevation of the right hemidiaphragm. Dual lead cardiac pacemaker is unchanged in position. Mild interstitial prominence bilateral without convincing pulmonary edema. No acute infiltrate. IMPRESSION: Dual lead cardiac pacemaker is unchanged in position. Minimal interstitial prominence bilaterally without pulmonary edema. No segmental infiltrate. Electronically Signed   By: Lahoma Crocker M.D.   On: 04/08/2016 08:59   Dg Chest Port 1 View  04/07/2016  CLINICAL DATA:  Dry cough 7 days.  Lethargy. EXAM: PORTABLE CHEST 1 VIEW COMPARISON:  12/19/2012 FINDINGS: Left-sided pacemaker unchanged. Lungs are adequately inflated without consolidation or effusion. Minimal prominence  of the perihilar markings likely mild vascular congestion. Cardiomediastinal silhouette is within normal. There is calcified plaque over the aortic arch. Remainder of the exam is unchanged. IMPRESSION: Suggestion of minimal vascular congestion. Electronically Signed   By: Marin Olp M.D.   On: 04/07/2016 10:55     Microbiology: Recent Results (from the past 240 hour(s))  MRSA PCR Screening     Status: None   Collection Time: 04/07/16  3:25 PM  Result Value Ref Range Status   MRSA by PCR NEGATIVE NEGATIVE Final    Comment:        The GeneXpert MRSA Assay (FDA approved for NASAL specimens only), is one component of a comprehensive MRSA colonization surveillance program. It is not intended to diagnose MRSA infection nor to guide or monitor treatment for MRSA infections.   Culture, blood (Routine X 2) w Reflex to ID Panel     Status: None (Preliminary result)   Collection Time: 04/07/16  3:29 PM  Result Value Ref Range Status   Specimen Description BLOOD LEFT [facility]  Final   Special Requests IN PEDIATRIC BOTTLE [place]  Final  Culture   Final    NO GROWTH < 24 HOURS Performed at [facility]    Report Status PENDING  Incomplete  Culture, blood (Routine X 2) w Reflex to ID Panel     Status: None (Preliminary result)   Collection Time: 04/07/16  3:29 PM  Result Value Ref Range Status   Specimen Description BLOOD LEFT HAND  Final   Special Requests IN PEDIATRIC BOTTLE [place]  Final   Culture   Final    NO GROWTH < 24 HOURS Performed at [facility]    Report Status PENDING  Incomplete  Respiratory Panel by PCR     Status: None   Collection Time: 04/08/16  4:44 PM  Result Value Ref Range Status   Adenovirus NOT DETECTED NOT DETECTED Final   Coronavirus 229E NOT DETECTED NOT DETECTED Final   Coronavirus HKU1 NOT DETECTED NOT DETECTED Final   Coronavirus NL63 NOT DETECTED NOT DETECTED Final   Coronavirus OC43 NOT DETECTED NOT DETECTED Final   Metapneumovirus NOT DETECTED  NOT DETECTED Final   Rhinovirus / Enterovirus NOT DETECTED NOT DETECTED Final   Influenza A NOT DETECTED NOT DETECTED Final   Influenza A H1 NOT DETECTED NOT DETECTED Final   Influenza A H1 2009 NOT DETECTED NOT DETECTED Final   Influenza A H3 NOT DETECTED NOT DETECTED Final   Influenza B NOT DETECTED NOT DETECTED Final   Parainfluenza Virus 1 NOT DETECTED NOT DETECTED Final   Parainfluenza Virus 2 NOT DETECTED NOT DETECTED Final   Parainfluenza Virus 3 NOT DETECTED NOT DETECTED Final   Parainfluenza Virus 4 NOT DETECTED NOT DETECTED Final   Respiratory Syncytial Virus NOT DETECTED NOT DETECTED Final   Bordetella pertussis NOT DETECTED NOT DETECTED Final   Chlamydophila pneumoniae NOT DETECTED NOT DETECTED Final   Mycoplasma pneumoniae NOT DETECTED NOT DETECTED Final     Labs: Basic Metabolic Panel:  Recent Labs Lab 04/07/16 1054 04/08/16 0550 04/09/16 0511  NA 143 140 138  K 3.2* 3.6 3.4*  CL 102 104 101  CO2 29 27 29   GLUCOSE 113* 105* 121*  BUN 30* 20 15  CREATININE 0.71 0.57 0.50  CALCIUM 9.5 9.1 9.1   Liver Function Tests:  Recent Labs Lab 04/07/16 1054  AST 18  ALT 12*  ALKPHOS 81  BILITOT 0.9  PROT 6.4*  ALBUMIN 3.6   No results for input(s): LIPASE, AMYLASE in the last 168 hours. No results for input(s): AMMONIA in the last 168 hours. CBC:  Recent Labs Lab 04/07/16 1054 04/08/16 0550 04/09/16 0511  WBC 12.0* 9.6 9.6  NEUTROABS 8.0*  --   --   HGB 13.8 13.5 13.9  HCT 41.7 40.8 41.6  MCV 96.8 96.2 95.4  PLT 157 148* 158   Cardiac Enzymes:  Recent Labs Lab 04/07/16 1054  TROPONINI <0.03   BNP: Invalid input(s): POCBNP CBG: No results for input(s): GLUCAP in the last 168 hours.  Time coordinating discharge:  Greater than 30 minutes  Signed:  Miciah Covelli, DO [facility] Pager: [phone] 04/10/2016, 10:08 AM

## 2016-04-09 NOTE — Progress Notes (Signed)
PROGRESS NOTE  Sydney Arroyo [id number] DOB: 07/04/1931 DOA: 04/07/2016 PCP: Hollace Kinnier, DO  Brief History:  82 y.o. female with medical history significant of advanced dementia, hypertension, dyslipidemia, status post pacemaker insertion in 2009, current resident at [facility] who was transferred to the emergency department for further evaluation of functional decline and cough. Sydney Arroyo is unable to provide history or participate in her on plan of care given advanced dementia. Staff members have noticed her to have increasing cough, congestion, shortness of breath over the past week becoming lethargic, less interactive, not acting at her baseline self.  Assessment/Plan: Acute metabolic encephalopathy -Likely due to viral respiratory infection, dehydration, and possible UTI -At the time of her last discharge on 12/06/2015, pt was awake and alert, oriented only to self, which was presumed to be her baseline -Discontinue amantadine as this can cause increasing confusion -12/01/2015 serum B12 695 -12/01/2015 TSH 2.212 -12/01/2015 ammonia 25 -04/09/16--more awake and conversant, pleasantly confused  Alzheimer Dementia with behavior disturbance  -Continue Namenda and Aricept -Minimize opioids and hypnotics  Failure to thrive/functional decline -Continue physical therapy -Serum B12--469 -Nutritional consult -Recheck TSH--2.211  Pyuria -Unfortunately, urine culture was not sent -Continue ceftriaxone, short course--plan last dose on 04/10/16  Acute bronchitis -influenza pcr neg -check viral respiratory panel  Hypertension -Continue amlodipine, metoprolol succinate, and losartan -Blood pressure controlled  Sick sinus syndrome -Status post permanent pacemaker -Monitor on telemetry   Disposition Plan: SNF in 1-2 days  Family Communication: No Family at beside  Consultants: None  Code Status: DNR  Subjective: Patient denies fevers, chills,  headache, chest pain, dyspnea, vomiting, diarrhea, abdominal pain   Objective: Filed Vitals:   04/08/16 1252 04/08/16 2208 04/09/16 0700 04/09/16 1333  BP: 133/75 130/67 143/71 134/67  Pulse: 78 79 71 75  Temp:  98.6 F (37 C) 98 F (36.7 C) 98.8 F (37.1 C)  TempSrc:  Axillary Axillary   Resp: 18 19 19 18   Weight:      SpO2: 100% 98% 96% 97%    Intake/Output Summary (Last 24 hours) at 04/09/16 1533 Last data filed at 04/09/16 1440  Gross per 24 hour  Intake    495 ml  Output      0 ml  Net    495 ml   Weight change:  Exam:   General:  Pt is alert, follows commands appropriately, not in acute distress  HEENT: No icterus, No thrush, No neck mass, [place]/AT  Cardiovascular: RRR, S1/S2, no rubs, no gallops  Respiratory: Bibasilar crackles, left greater than right. No wheezing. Good air movement.  Abdomen: Soft/+BS, non tender, non distended, no guarding  Extremities: No edema, No lymphangitis, No petechiae, No rashes, no synovitis   Data Reviewed: I have personally reviewed following labs and imaging studies Basic Metabolic Panel:  Recent Labs Lab 04/07/16 1054 04/08/16 0550 04/09/16 0511  NA 143 140 138  K 3.2* 3.6 3.4*  CL 102 104 101  CO2 29 27 29   GLUCOSE 113* 105* 121*  BUN 30* 20 15  CREATININE 0.71 0.57 0.50  CALCIUM 9.5 9.1 9.1   Liver Function Tests:  Recent Labs Lab 04/07/16 1054  AST 18  ALT 12*  ALKPHOS 81  BILITOT 0.9  PROT 6.4*  ALBUMIN 3.6   No results for input(s): LIPASE, AMYLASE in the last 168 hours. No results for input(s): AMMONIA in the last 168 hours. Coagulation Profile: No results for input(s): INR, PROTIME in the last  168 hours. CBC:  Recent Labs Lab 04/07/16 1054 04/08/16 0550 04/09/16 0511  WBC 12.0* 9.6 9.6  NEUTROABS 8.0*  --   --   HGB 13.8 13.5 13.9  HCT 41.7 40.8 41.6  MCV 96.8 96.2 95.4  PLT 157 148* 158   Cardiac Enzymes:  Recent Labs Lab 04/07/16 1054  TROPONINI <0.03   BNP: Invalid  input(s): POCBNP CBG: No results for input(s): GLUCAP in the last 168 hours. HbA1C: No results for input(s): HGBA1C in the last 72 hours. Urine analysis:    Component Value Date/Time   COLORURINE YELLOW 04/07/2016 1102   APPEARANCEUR CLOUDY* 04/07/2016 1102   LABSPEC 1.017 04/07/2016 1102   PHURINE 6.0 04/07/2016 1102   GLUCOSEU NEGATIVE 04/07/2016 1102   HGBUR NEGATIVE 04/07/2016 [place] 04/07/2016 [place] 04/07/2016 1102   PROTEINUR NEGATIVE 04/07/2016 1102   NITRITE NEGATIVE 04/07/2016 1102   LEUKOCYTESUR MODERATE* 04/07/2016 1102   Sepsis Labs: @LABRCNTIP (procalcitonin:4,lacticidven:4) ) Recent Results (from the past 240 hour(s))  MRSA PCR Screening     Status: None   Collection Time: 04/07/16  3:25 PM  Result Value Ref Range Status   MRSA by PCR NEGATIVE NEGATIVE Final    Comment:        The GeneXpert MRSA Assay (FDA approved for NASAL specimens only), is one component of a comprehensive MRSA colonization surveillance program. It is not intended to diagnose MRSA infection nor to guide or monitor treatment for MRSA infections.   Culture, blood (Routine X 2) w Reflex to ID Panel     Status: None (Preliminary result)   Collection Time: 04/07/16  3:29 PM  Result Value Ref Range Status   Specimen Description BLOOD LEFT AC  Final   Special Requests IN PEDIATRIC BOTTLE [place]  Final   Culture   Final    NO GROWTH < 24 HOURS Performed at [facility]    Report Status PENDING  Incomplete  Culture, blood (Routine X 2) w Reflex to ID Panel     Status: None (Preliminary result)   Collection Time: 04/07/16  3:29 PM  Result Value Ref Range Status   Specimen Description BLOOD LEFT HAND  Final   Special Requests IN PEDIATRIC BOTTLE [place]  Final   Culture   Final    NO GROWTH < 24 HOURS Performed at [facility]    Report Status PENDING  Incomplete  Respiratory Panel by PCR     Status: None   Collection Time: 04/08/16   4:44 PM  Result Value Ref Range Status   Adenovirus NOT DETECTED NOT DETECTED Final   Coronavirus 229E NOT DETECTED NOT DETECTED Final   Coronavirus HKU1 NOT DETECTED NOT DETECTED Final   Coronavirus NL63 NOT DETECTED NOT DETECTED Final   Coronavirus OC43 NOT DETECTED NOT DETECTED Final   Metapneumovirus NOT DETECTED NOT DETECTED Final   Rhinovirus / Enterovirus NOT DETECTED NOT DETECTED Final   Influenza A NOT DETECTED NOT DETECTED Final   Influenza A H1 NOT DETECTED NOT DETECTED Final   Influenza A H1 2009 NOT DETECTED NOT DETECTED Final   Influenza A H3 NOT DETECTED NOT DETECTED Final   Influenza B NOT DETECTED NOT DETECTED Final   Parainfluenza Virus 1 NOT DETECTED NOT DETECTED Final   Parainfluenza Virus 2 NOT DETECTED NOT DETECTED Final   Parainfluenza Virus 3 NOT DETECTED NOT DETECTED Final   Parainfluenza Virus 4 NOT DETECTED NOT DETECTED Final   Respiratory Syncytial Virus NOT DETECTED NOT  DETECTED Final   Bordetella pertussis NOT DETECTED NOT DETECTED Final   Chlamydophila pneumoniae NOT DETECTED NOT DETECTED Final   Mycoplasma pneumoniae NOT DETECTED NOT DETECTED Final     Scheduled Meds: . amLODipine  5 mg Oral Daily  . cefTRIAXone (ROCEPHIN)  IV  1 g Intravenous Q24H  . colesevelam  625 mg Oral QAC breakfast  . donepezil  10 mg Oral Daily  . DULoxetine  60 mg Oral Daily  . enoxaparin (LOVENOX) injection  40 mg Subcutaneous Q24H  . losartan  100 mg Oral Daily  . memantine  28 mg Oral BH-q7a  . metoprolol succinate  25 mg Oral Daily  . montelukast  10 mg Oral QHS   Continuous Infusions: . 0.9 % NaCl with KCl 20 mEq / L 75 mL/hr at 04/09/16 1018    Procedures/Studies: Dg Chest Port 1 View  04/08/2016  CLINICAL DATA:  Healthcare associated pneumonia EXAM: PORTABLE CHEST 1 VIEW COMPARISON:  04/07/2016 FINDINGS: Cardiomediastinal silhouette is stable. Mild elevation of the right hemidiaphragm. Dual lead cardiac pacemaker is unchanged in position. Mild interstitial  prominence bilateral without convincing pulmonary edema. No acute infiltrate. IMPRESSION: Dual lead cardiac pacemaker is unchanged in position. Minimal interstitial prominence bilaterally without pulmonary edema. No segmental infiltrate. Electronically Signed   By: Lahoma Crocker M.D.   On: 04/08/2016 08:59   Dg Chest Port 1 View  04/07/2016  CLINICAL DATA:  Dry cough 7 days.  Lethargy. EXAM: PORTABLE CHEST 1 VIEW COMPARISON:  12/19/2012 FINDINGS: Left-sided pacemaker unchanged. Lungs are adequately inflated without consolidation or effusion. Minimal prominence of the perihilar markings likely mild vascular congestion. Cardiomediastinal silhouette is within normal. There is calcified plaque over the aortic arch. Remainder of the exam is unchanged. IMPRESSION: Suggestion of minimal vascular congestion. Electronically Signed   By: Marin Olp M.D.   On: 04/07/2016 10:55    Ebubechukwu Jedlicka, DO  [facility] Pager [phone]  If 7PM-7AM, please contact night-coverage [email or web address] Password [email or web address] 04/09/2016, 3:33 PM   LOS: 2 days
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
## 2016-04-10 NOTE — Progress Notes (Signed)
Pt stable. IV removed. Tele d/c'd. Report called to [facility] assisted [facility], [place]. [facility] called for transport. Kizzie Ide, RN

## 2016-04-10 NOTE — Progress Notes (Signed)
LCSW spoke to patient's care coordinator at facility. Agreeable for return today. All clinicals sent through the [place]. Packet completed for patient.  Facility reports unable to transport, thus will send patient by EMS and LCSW arranged. Received confirmation from nephew who was agreeable to plan. DNR came from facility and sent back.  No other needs. DC to [facility].  Lane Hacker, MSW Clinical Social Work: System [employer] [phone]

## 2016-04-10 NOTE — NC FL2 (Signed)
[place] LEVEL OF CARE SCREENING TOOL     IDENTIFICATION  Patient Name: Sydney Arroyo Birthdate: 13-Apr-1931 Sex: female Admission Date (Current Location): 04/07/2016  [facility] and [place] Number:  Herbalist and Address:  [facility],  [place] [address] [place]      Provider Number: [id number]  Attending Physician Name and Address:  Orson Eva, MD  Relative Name and Phone Number:       Current Level of Care: Hospital Recommended Level of Care: [place] Prior Approval Number:    Date Approved/Denied:   PASRR Number:    Discharge Plan: Other (Comment) (return to ALF: [facility] )    Current Diagnoses: Patient Active Problem List   Diagnosis Date Noted  . Lower respiratory infection (e.g., bronchitis, pneumonia, pneumonitis, pulmonitis)   . Lower respiratory infection 04/07/2016  . Healthcare-associated pneumonia 04/07/2016  . FTT (failure to thrive) in adult 04/07/2016  . Loss of weight 02/20/2016  . Vitamin D deficiency 02/20/2016  . Aneurysm of other precerebral arteries 02/20/2016  . HLD (hyperlipidemia) 09/12/2015  . Alzheimer's dementia with behavioral disturbance 05/24/2015  . Sick sinus syndrome ([place]) 03/02/2015  . Depression 03/17/2014  . Hearing loss 02/24/2014  . Advanced care planning/counseling discussion 02/24/2014  . History of breast cancer 09/16/2013  . Osteoarthritis of hand 06/22/2013  . Vertigo 04/09/2013  . Hypertension   . Spinal stenosis of lumbar region 04/08/2013  . Bradycardia 01/18/2013    Orientation RESPIRATION BLADDER Height & Weight     Self  Normal Incontinent Weight: 160 lb 15 oz (73 kg) Height:  5\' 2"  (157.5 cm)  BEHAVIORAL SYMPTOMS/MOOD NEUROLOGICAL BOWEL NUTRITION STATUS  Other (Comment) (pleasantly confused)   Incontinent Diet (regular diet)  AMBULATORY STATUS COMMUNICATION OF NEEDS Skin   Limited Assist Verbally Normal                       Personal  Care Assistance Level of Assistance  Bathing, Feeding, Dressing Bathing Assistance: Limited assistance Feeding assistance: Limited assistance Dressing Assistance: Limited assistance     Functional Limitations Info  Sight, Hearing, Speech Sight Info: Adequate Hearing Info: Adequate Speech Info: Adequate    SPECIAL CARE FACTORS FREQUENCY                       Contractures Contractures Info: Not present    Additional Factors Info  Code Status, Allergies Code Status Info: DNR Allergies Info: Demerol, Ketorolac, Percocet, Toradol           Current Medications (04/10/2016):  This is the current hospital active medication list Current Facility-Administered Medications  Medication Dose Route Frequency Provider Last Rate Last Dose  . 0.9 % NaCl with KCl 20 mEq/ L  infusion   Intravenous Continuous Orson Eva, MD 75 mL/hr at 04/09/16 2343    . amLODipine (NORVASC) tablet 5 mg  5 mg Oral Daily Kelvin Cellar, MD   5 mg at 04/10/16 1016  . cefTRIAXone (ROCEPHIN) 1 g in dextrose 5 % 50 mL IVPB  1 g Intravenous Q24H Kelvin Cellar, MD   1 g at 04/10/16 0755  . colesevelam [facility]) tablet 625 mg  625 mg Oral QAC breakfast Kelvin Cellar, MD   625 mg at 04/10/16 0756  . donepezil (ARICEPT) tablet 10 mg  10 mg Oral Daily Kelvin Cellar, MD   10 mg at 04/10/16 1016  . DULoxetine (CYMBALTA) DR capsule 60 mg  60 mg Oral  Daily Kelvin Cellar, MD   60 mg at 04/10/16 1016  . enoxaparin (LOVENOX) injection 40 mg  40 mg Subcutaneous Q24H Kelvin Cellar, MD   40 mg at 04/09/16 2125  . losartan (COZAAR) tablet 100 mg  100 mg Oral Daily Kelvin Cellar, MD   100 mg at 04/10/16 1016  . memantine (NAMENDA XR) 24 hr capsule 28 mg  28 mg Oral Lemar Lofty, MD   28 mg at 04/10/16 0756  . metoprolol succinate (TOPROL-XL) 24 hr tablet 25 mg  25 mg Oral Daily Kelvin Cellar, MD   25 mg at 04/10/16 1016  . montelukast (SINGULAIR) tablet 10 mg  10 mg Oral QHS Kelvin Cellar, MD   10 mg at  04/09/16 2203     Discharge Medications: Please see discharge summary for a list of discharge medications.  Relevant Imaging Results:  Relevant Lab Results:   Additional Information    [name], LCSW

## 2016-04-12 LAB — CULTURE, BLOOD (ROUTINE X 2)
CULTURE: NO GROWTH
Culture: NO GROWTH

## 2016-04-13 DIAGNOSIS — M6281 Muscle weakness (generalized): Secondary | ICD-10-CM | POA: Diagnosis not present

## 2016-04-13 DIAGNOSIS — G308 Other Alzheimer's disease: Secondary | ICD-10-CM | POA: Diagnosis not present

## 2016-04-13 DIAGNOSIS — M4806 Spinal stenosis, lumbar region: Secondary | ICD-10-CM | POA: Diagnosis not present

## 2016-04-16 ENCOUNTER — Encounter: Payer: Self-pay | Admitting: Nurse Practitioner
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
## 2016-04-17 ENCOUNTER — Encounter: Payer: Self-pay | Admitting: Internal Medicine

## 2016-04-17 ENCOUNTER — Ambulatory Visit (INDEPENDENT_AMBULATORY_CARE_PROVIDER_SITE_OTHER): Payer: Medicare Other | Admitting: Nurse Practitioner

## 2016-04-17 ENCOUNTER — Non-Acute Institutional Stay (SKILLED_NURSING_FACILITY): Payer: Medicare Other | Admitting: Internal Medicine

## 2016-04-17 ENCOUNTER — Encounter: Payer: Self-pay | Admitting: Nurse Practitioner

## 2016-04-17 VITALS — BP 130/82 | HR 84 | Ht 62.0 in

## 2016-04-17 DIAGNOSIS — I480 Paroxysmal atrial fibrillation: Secondary | ICD-10-CM

## 2016-04-17 DIAGNOSIS — J208 Acute bronchitis due to other specified organisms: Secondary | ICD-10-CM | POA: Diagnosis not present

## 2016-04-17 DIAGNOSIS — I10 Essential (primary) hypertension: Secondary | ICD-10-CM

## 2016-04-17 DIAGNOSIS — F0281 Dementia in other diseases classified elsewhere with behavioral disturbance: Secondary | ICD-10-CM

## 2016-04-17 DIAGNOSIS — G308 Other Alzheimer's disease: Secondary | ICD-10-CM | POA: Diagnosis not present

## 2016-04-17 DIAGNOSIS — G309 Alzheimer's disease, unspecified: Secondary | ICD-10-CM

## 2016-04-17 DIAGNOSIS — R627 Adult failure to thrive: Secondary | ICD-10-CM | POA: Diagnosis not present

## 2016-04-17 DIAGNOSIS — I495 Sick sinus syndrome: Secondary | ICD-10-CM | POA: Diagnosis not present

## 2016-04-17 DIAGNOSIS — R410 Disorientation, unspecified: Secondary | ICD-10-CM

## 2016-04-17 DIAGNOSIS — N3000 Acute cystitis without hematuria: Secondary | ICD-10-CM

## 2016-04-17 LAB — CBC AND DIFFERENTIAL
HEMATOCRIT: 43 % (ref 36–46)
Hemoglobin: 13.6 g/dL (ref 12.0–16.0)
PLATELETS: 178 10*3/uL (ref 150–399)
WBC: 6.9 10*3/mL

## 2016-04-17 LAB — BASIC METABOLIC PANEL
BUN: 10 mg/dL (ref 4–21)
CREATININE: 0.6 mg/dL (ref 0.5–1.1)
Glucose: 108 mg/dL
Potassium: 4 mmol/L (ref 3.4–5.3)
Sodium: 141 mmol/L (ref 137–147)

## 2016-04-17 NOTE — Patient Instructions (Signed)
Medication Instructions:   Your physician recommends that you continue on your current medications as directed. Please refer to the Current Medication list given to you today.   If you need a refill on your cardiac medications before your next appointment, please call your pharmacy.  Labwork: NONE ORDER TODAY    Testing/Procedures: NONE ORDER TODAY   Follow-Up: IN 6 MONTHS WITH DEVICE CLINIC A REMINDER  LETTER WILL BE MAILED TO REMIND YOU OF MAKING APPT    Your physician wants you to follow-up in:  IN [place] will receive a reminder letter in the mail two months in advance. If you don't receive a letter, please call our office to schedule the follow-up appointment.       Any Other Special Instructions Will Be Listed Below (If Applicable).
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
## 2016-04-21 LAB — CUP PACEART INCLINIC DEVICE CHECK
Implantable Lead Implant Date: 20090618
Implantable Lead Location: 753859
Implantable Lead Location: 753860
Implantable Lead Model: 4086
Implantable Lead Model: 4087
Implantable Lead Serial Number: 253015
MDC IDC LEAD IMPLANT DT: 20090618
MDC IDC LEAD SERIAL: 288425
MDC IDC SESS DTM: 20170527040210
Pulse Gen Serial Number: 588589

## 2016-05-14 ENCOUNTER — Emergency Department (HOSPITAL_COMMUNITY)
Admission: EM | Admit: 2016-05-14 | Discharge: 2016-05-14 | Disposition: A | Discharge status: Home / Self Care | Payer: Medicare Other | Attending: Emergency Medicine | Admitting: Emergency Medicine

## 2016-05-14 ENCOUNTER — Encounter (HOSPITAL_COMMUNITY): Payer: Self-pay | Admitting: Emergency Medicine

## 2016-05-14 DIAGNOSIS — W1800XA Striking against unspecified object with subsequent fall, initial encounter: Secondary | ICD-10-CM | POA: Insufficient documentation

## 2016-05-14 DIAGNOSIS — F329 Major depressive disorder, single episode, unspecified: Secondary | ICD-10-CM | POA: Insufficient documentation

## 2016-05-14 DIAGNOSIS — Z79899 Other long term (current) drug therapy: Secondary | ICD-10-CM | POA: Diagnosis not present

## 2016-05-14 DIAGNOSIS — Z87891 Personal history of nicotine dependence: Secondary | ICD-10-CM | POA: Insufficient documentation

## 2016-05-14 DIAGNOSIS — Y929 Unspecified place or not applicable: Secondary | ICD-10-CM | POA: Insufficient documentation

## 2016-05-14 DIAGNOSIS — Y999 Unspecified external cause status: Secondary | ICD-10-CM | POA: Diagnosis not present

## 2016-05-14 DIAGNOSIS — W19XXXA Unspecified fall, initial encounter: Secondary | ICD-10-CM

## 2016-05-14 DIAGNOSIS — S0101XA Laceration without foreign body of scalp, initial encounter: Secondary | ICD-10-CM

## 2016-05-14 DIAGNOSIS — I10 Essential (primary) hypertension: Secondary | ICD-10-CM | POA: Insufficient documentation

## 2016-05-14 DIAGNOSIS — Y939 Activity, unspecified: Secondary | ICD-10-CM | POA: Diagnosis not present

## 2016-05-14 DIAGNOSIS — Z853 Personal history of malignant neoplasm of breast: Secondary | ICD-10-CM | POA: Insufficient documentation

## 2016-05-14 DIAGNOSIS — S0990XA Unspecified injury of head, initial encounter: Secondary | ICD-10-CM | POA: Diagnosis not present

## 2016-05-14 DIAGNOSIS — S0191XA Laceration without foreign body of unspecified part of head, initial encounter: Secondary | ICD-10-CM | POA: Diagnosis not present

## 2016-05-14 DIAGNOSIS — R259 Unspecified abnormal involuntary movements: Secondary | ICD-10-CM | POA: Diagnosis not present

## 2016-05-14 DIAGNOSIS — S0180XA Unspecified open wound of other part of head, initial encounter: Secondary | ICD-10-CM | POA: Diagnosis not present

## 2016-05-14 NOTE — ED Notes (Signed)
Patient was alert, oriented and stable upon discharge. RN went over AVS and patient had no further questions.  

## 2016-05-14 NOTE — ED Notes (Signed)
Per [facility] pt from memory care unit at [facility] / pt was ambulating with assistance when she stumbled , hit head against the hand rail. Left top head laceration, bleeding controlled. No blood thinner intake. No loc. Pt also has right shoulder abrasion below shoulder blade. Alert and oriented x1 normal to baseline per EMS.
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
## 2016-05-14 NOTE — ED Notes (Signed)
Report called to [facility], to pt's care giver.

## 2016-05-14 NOTE — ED Notes (Signed)
[facility] has been called for transportation.  

## 2016-05-16 DIAGNOSIS — M545 Low back pain: Secondary | ICD-10-CM | POA: Diagnosis not present

## 2016-05-18 ENCOUNTER — Non-Acute Institutional Stay: Payer: Medicare Other | Admitting: Adult Health

## 2016-05-18 ENCOUNTER — Encounter: Payer: Self-pay | Admitting: Adult Health

## 2016-05-18 DIAGNOSIS — M545 Low back pain, unspecified: Secondary | ICD-10-CM

## 2016-05-18 DIAGNOSIS — S0101XA Laceration without foreign body of scalp, initial encounter: Secondary | ICD-10-CM
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
## 2016-06-15 ENCOUNTER — Non-Acute Institutional Stay: Payer: Medicare Other | Admitting: Adult Health

## 2016-06-15 DIAGNOSIS — I10 Essential (primary) hypertension: Secondary | ICD-10-CM

## 2016-06-15 DIAGNOSIS — F0281 Dementia in other diseases classified elsewhere with behavioral disturbance: Secondary | ICD-10-CM

## 2016-06-15 DIAGNOSIS — M4806 Spinal stenosis, lumbar region: Secondary | ICD-10-CM | POA: Diagnosis not present

## 2016-06-15 DIAGNOSIS — F32A Depression, unspecified: Secondary | ICD-10-CM

## 2016-06-15 DIAGNOSIS — E785 Hyperlipidemia, unspecified: Secondary | ICD-10-CM | POA: Diagnosis not present

## 2016-06-15 DIAGNOSIS — F329 Major depressive disorder, single episode, unspecified: Secondary | ICD-10-CM

## 2016-06-15 DIAGNOSIS — G308 Other Alzheimer's disease: Secondary | ICD-10-CM

## 2016-06-15 DIAGNOSIS — I495 Sick sinus syndrome: Secondary | ICD-10-CM

## 2016-06-15 DIAGNOSIS — G309 Alzheimer's disease, unspecified: Secondary | ICD-10-CM

## 2016-06-15 DIAGNOSIS — M48061 Spinal stenosis, lumbar region without neurogenic claudication: Secondary | ICD-10-CM

## 2016-06-20 ENCOUNTER — Encounter: Payer: Self-pay | Admitting: Adult Health
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
## 2016-08-14 ENCOUNTER — Non-Acute Institutional Stay: Payer: Medicare Other | Admitting: Internal Medicine

## 2016-08-14 ENCOUNTER — Encounter: Payer: Self-pay | Admitting: Internal Medicine

## 2016-08-14 DIAGNOSIS — F0281 Dementia in other diseases classified elsewhere with behavioral disturbance: Secondary | ICD-10-CM

## 2016-08-14 DIAGNOSIS — F02818 Dementia in other diseases classified elsewhere, unspecified severity, with other behavioral disturbance: Secondary | ICD-10-CM

## 2016-08-14 DIAGNOSIS — M4806 Spinal stenosis, lumbar region: Secondary | ICD-10-CM

## 2016-08-14 DIAGNOSIS — I10 Essential (primary) hypertension: Secondary | ICD-10-CM

## 2016-08-14 DIAGNOSIS — G301 Alzheimer's disease with late onset: Secondary | ICD-10-CM | POA: Diagnosis not present

## 2016-08-14 DIAGNOSIS — F3341 Major depressive disorder, recurrent, in partial remission: Secondary | ICD-10-CM | POA: Diagnosis not present

## 2016-08-14 DIAGNOSIS — I495 Sick sinus syndrome: Secondary | ICD-10-CM | POA: Diagnosis not present

## 2016-08-14 DIAGNOSIS — R627 Adult failure to thrive: Secondary | ICD-10-CM

## 2016-08-14 DIAGNOSIS — M48061 Spinal stenosis, lumbar region without neurogenic claudication: Secondary | ICD-10-CM
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
## 2016-09-20 DIAGNOSIS — Z23 Encounter for immunization: Secondary | ICD-10-CM | POA: Diagnosis not present

## 2016-10-11 ENCOUNTER — Encounter: Payer: Self-pay | Admitting: Adult Health

## 2016-10-11 ENCOUNTER — Non-Acute Institutional Stay: Payer: Medicare Other | Admitting: Adult Health

## 2016-10-11 DIAGNOSIS — F3341 Major depressive disorder, recurrent, in partial remission: Secondary | ICD-10-CM | POA: Diagnosis not present

## 2016-10-11 DIAGNOSIS — F0281 Dementia in other diseases classified elsewhere with behavioral disturbance: Secondary | ICD-10-CM | POA: Diagnosis not present

## 2016-10-11 DIAGNOSIS — F02818 Alzheimer's disease with late onset: Secondary | ICD-10-CM

## 2016-10-11 DIAGNOSIS — I10 Essential (primary) hypertension: Secondary | ICD-10-CM

## 2016-10-11 DIAGNOSIS — I725 Aneurysm of other precerebral arteries: Secondary | ICD-10-CM

## 2016-10-11 DIAGNOSIS — R001 Bradycardia, unspecified: Secondary | ICD-10-CM | POA: Diagnosis not present

## 2016-10-11 DIAGNOSIS — G301 Alzheimer's disease with late onset: Secondary | ICD-10-CM

## 2016-10-11 DIAGNOSIS — E559 Vitamin D deficiency, unspecified: Secondary | ICD-10-CM | POA: Diagnosis not present

## 2016-10-11 DIAGNOSIS — M48061 Spinal stenosis, lumbar region without neurogenic claudication: Secondary | ICD-10-CM

## 2016-10-11 DIAGNOSIS — E782 Mixed hyperlipidemia: Secondary | ICD-10-CM | POA: Diagnosis not present
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
## 2016-10-12 DIAGNOSIS — I10 Essential (primary) hypertension: Secondary | ICD-10-CM | POA: Diagnosis not present

## 2016-10-12 LAB — BASIC METABOLIC PANEL
BUN: 16 mg/dL (ref 4–21)
Creatinine: 0.6 mg/dL (ref 0.5–1.1)
GLUCOSE: 108 mg/dL
POTASSIUM: 4.1 mmol/L (ref 3.4–5.3)
Sodium: 145 mmol/L (ref 137–147)

## 2016-10-29 ENCOUNTER — Ambulatory Visit (INDEPENDENT_AMBULATORY_CARE_PROVIDER_SITE_OTHER): Payer: Medicare Other | Admitting: *Deleted

## 2016-10-29 DIAGNOSIS — I495 Sick sinus syndrome: Secondary | ICD-10-CM

## 2016-10-30 LAB — CUP PACEART INCLINIC DEVICE CHECK
Brady Statistic RA Percent Paced: 1 %
Implantable Lead Implant Date: 20090618
Implantable Lead Location: 753859
Implantable Lead Model: 4086
Implantable Lead Serial Number: 253015
Lead Channel Pacing Threshold Amplitude: 0.8 V
Lead Channel Pacing Threshold Pulse Width: 0.5 ms
Lead Channel Sensing Intrinsic Amplitude: 12 mV
Lead Channel Setting Pacing Amplitude: 2 V
Lead Channel Setting Pacing Amplitude: 2.4 V
Lead Channel Setting Pacing Pulse Width: 0.5 ms
Lead Channel Setting Sensing Sensitivity: 2.5 mV
MDC IDC LEAD IMPLANT DT: 20090618
MDC IDC LEAD LOCATION: 753860
MDC IDC LEAD SERIAL: 288425
MDC IDC MSMT LEADCHNL RA IMPEDANCE VALUE: 600 Ohm
MDC IDC MSMT LEADCHNL RA PACING THRESHOLD AMPLITUDE: 0.2 V
MDC IDC MSMT LEADCHNL RA PACING THRESHOLD PULSEWIDTH: 0.5 ms
MDC IDC MSMT LEADCHNL RA SENSING INTR AMPL: 2.8 mV
MDC IDC MSMT LEADCHNL RV IMPEDANCE VALUE: 890 Ohm
MDC IDC PG IMPLANT DT: 20090618
MDC IDC PG SERIAL: 588589
MDC IDC SESS DTM: 20171204050000
MDC IDC STAT BRADY RV PERCENT PACED: 0 %

## 2016-10-30 NOTE — Progress Notes (Signed)
Pacemaker check in clinic. Normal device function. Thresholds, sensing, impedances consistent with previous measurements. Device programmed to maximize longevity. 23 mode switches (0%) or high ventricular rates noted. Device programmed at appropriate safety margins. Histogram distribution appropriate for patient activity level. Device programmed to optimize intrinsic conduction. Estimated longevity 3.43yrs. ROV with AS 03/2017. Patient education completed.

## 2016-11-01 ENCOUNTER — Encounter: Payer: Self-pay | Admitting: Adult Health

## 2016-11-01 ENCOUNTER — Non-Acute Institutional Stay: Payer: Medicare Other | Admitting: Adult Health

## 2016-11-01 DIAGNOSIS — M48061 Spinal stenosis, lumbar region without neurogenic claudication: Secondary | ICD-10-CM | POA: Diagnosis not present

## 2016-11-01 DIAGNOSIS — I10 Essential (primary) hypertension: Secondary | ICD-10-CM
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
## 2016-11-05 ENCOUNTER — Non-Acute Institutional Stay: Payer: Medicare Other | Admitting: Adult Health

## 2016-11-05 ENCOUNTER — Encounter: Payer: Self-pay | Admitting: Adult Health

## 2016-11-05 DIAGNOSIS — R0902 Hypoxemia: Secondary | ICD-10-CM

## 2016-11-05 DIAGNOSIS — K5901 Slow transit constipation: Secondary | ICD-10-CM | POA: Diagnosis not present

## 2016-11-05 DIAGNOSIS — R5383 Other fatigue: Secondary | ICD-10-CM

## 2016-11-05 DIAGNOSIS — I10 Essential (primary) hypertension: Secondary | ICD-10-CM | POA: Diagnosis not present

## 2016-11-05 DIAGNOSIS — R0989 Other specified symptoms and signs involving the circulatory and respiratory systems: Secondary | ICD-10-CM | POA: Diagnosis not present
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
## 2016-11-06 DIAGNOSIS — Z79899 Other long term (current) drug therapy: Secondary | ICD-10-CM | POA: Diagnosis not present

## 2016-11-06 DIAGNOSIS — R319 Hematuria, unspecified: Secondary | ICD-10-CM | POA: Diagnosis not present

## 2016-11-06 DIAGNOSIS — R5383 Other fatigue: Secondary | ICD-10-CM | POA: Diagnosis not present

## 2016-11-06 DIAGNOSIS — N390 Urinary tract infection, site not specified: Secondary | ICD-10-CM | POA: Diagnosis not present

## 2016-11-06 DIAGNOSIS — R509 Fever, unspecified: Secondary | ICD-10-CM | POA: Diagnosis not present

## 2016-11-06 LAB — BASIC METABOLIC PANEL
BUN: 21 mg/dL (ref 4–21)
Creatinine: 0.8 mg/dL (ref 0.5–1.1)
Glucose: 131 mg/dL
Potassium: 3.8 mmol/L (ref 3.4–5.3)
Sodium: 145 mmol/L (ref 137–147)

## 2016-11-06 LAB — CBC AND DIFFERENTIAL
HCT: 44 % (ref 36–46)
HEMOGLOBIN: 14.4 g/dL (ref 12.0–16.0)
PLATELETS: 182 10*3/uL (ref 150–399)
WBC: 11.1 10*3/mL

## 2016-11-21 DIAGNOSIS — R293 Abnormal posture: Secondary | ICD-10-CM | POA: Diagnosis not present

## 2016-11-21 DIAGNOSIS — G218 Other secondary parkinsonism: Secondary | ICD-10-CM | POA: Diagnosis not present

## 2016-11-21 DIAGNOSIS — M6259 Muscle wasting and atrophy, not elsewhere classified, multiple sites: Secondary | ICD-10-CM | POA: Diagnosis not present

## 2016-11-21 DIAGNOSIS — R278 Other lack of coordination: Secondary | ICD-10-CM | POA: Diagnosis not present

## 2016-11-22 DIAGNOSIS — R278 Other lack of coordination: Secondary | ICD-10-CM | POA: Diagnosis not present

## 2016-11-22 DIAGNOSIS — R293 Abnormal posture: Secondary | ICD-10-CM | POA: Diagnosis not present

## 2016-11-22 DIAGNOSIS — G218 Other secondary parkinsonism: Secondary | ICD-10-CM | POA: Diagnosis not present

## 2016-11-22 DIAGNOSIS — M6259 Muscle wasting and atrophy, not elsewhere classified, multiple sites: Secondary | ICD-10-CM | POA: Diagnosis not present

## 2016-11-23 DIAGNOSIS — M6259 Muscle wasting and atrophy, not elsewhere classified, multiple sites: Secondary | ICD-10-CM | POA: Diagnosis not present

## 2016-11-23 DIAGNOSIS — R278 Other lack of coordination: Secondary | ICD-10-CM | POA: Diagnosis not present

## 2016-11-23 DIAGNOSIS — R293 Abnormal posture: Secondary | ICD-10-CM | POA: Diagnosis not present

## 2016-11-23 DIAGNOSIS — G218 Other secondary parkinsonism: Secondary | ICD-10-CM | POA: Diagnosis not present

## 2016-11-28 DIAGNOSIS — M6259 Muscle wasting and atrophy, not elsewhere classified, multiple sites: Secondary | ICD-10-CM | POA: Diagnosis not present

## 2016-11-28 DIAGNOSIS — R278 Other lack of coordination: Secondary | ICD-10-CM | POA: Diagnosis not present

## 2016-11-28 DIAGNOSIS — R293 Abnormal posture: Secondary | ICD-10-CM | POA: Diagnosis not present

## 2016-11-28 DIAGNOSIS — G218 Other secondary parkinsonism: Secondary | ICD-10-CM | POA: Diagnosis not present

## 2016-11-29 DIAGNOSIS — M6259 Muscle wasting and atrophy, not elsewhere classified, multiple sites: Secondary | ICD-10-CM | POA: Diagnosis not present

## 2016-11-29 DIAGNOSIS — R278 Other lack of coordination: Secondary | ICD-10-CM | POA: Diagnosis not present

## 2016-11-29 DIAGNOSIS — R293 Abnormal posture: Secondary | ICD-10-CM | POA: Diagnosis not present

## 2016-11-29 DIAGNOSIS — G218 Other secondary parkinsonism: Secondary | ICD-10-CM | POA: Diagnosis not present

## 2016-12-03 DIAGNOSIS — M6259 Muscle wasting and atrophy, not elsewhere classified, multiple sites: Secondary | ICD-10-CM | POA: Diagnosis not present

## 2016-12-03 DIAGNOSIS — R293 Abnormal posture: Secondary | ICD-10-CM | POA: Diagnosis not present

## 2016-12-03 DIAGNOSIS — R278 Other lack of coordination: Secondary | ICD-10-CM | POA: Diagnosis not present

## 2016-12-03 DIAGNOSIS — G218 Other secondary parkinsonism: Secondary | ICD-10-CM | POA: Diagnosis not present

## 2016-12-05 DIAGNOSIS — R278 Other lack of coordination: Secondary | ICD-10-CM | POA: Diagnosis not present

## 2016-12-05 DIAGNOSIS — R293 Abnormal posture: Secondary | ICD-10-CM | POA: Diagnosis not present

## 2016-12-05 DIAGNOSIS — G218 Other secondary parkinsonism: Secondary | ICD-10-CM | POA: Diagnosis not present

## 2016-12-05 DIAGNOSIS — M6259 Muscle wasting and atrophy, not elsewhere classified, multiple sites: Secondary | ICD-10-CM | POA: Diagnosis not present

## 2016-12-11 DIAGNOSIS — G218 Other secondary parkinsonism: Secondary | ICD-10-CM | POA: Diagnosis not present

## 2016-12-11 DIAGNOSIS — R278 Other lack of coordination: Secondary | ICD-10-CM | POA: Diagnosis not present

## 2016-12-11 DIAGNOSIS — M6259 Muscle wasting and atrophy, not elsewhere classified, multiple sites: Secondary | ICD-10-CM | POA: Diagnosis not present

## 2016-12-11 DIAGNOSIS — R293 Abnormal posture: Secondary | ICD-10-CM | POA: Diagnosis not present

## 2016-12-14 DIAGNOSIS — R278 Other lack of coordination: Secondary | ICD-10-CM | POA: Diagnosis not present

## 2016-12-14 DIAGNOSIS — G218 Other secondary parkinsonism: Secondary | ICD-10-CM | POA: Diagnosis not present

## 2016-12-14 DIAGNOSIS — M6259 Muscle wasting and atrophy, not elsewhere classified, multiple sites: Secondary | ICD-10-CM | POA: Diagnosis not present

## 2016-12-14 DIAGNOSIS — R293 Abnormal posture: Secondary | ICD-10-CM | POA: Diagnosis not present

## 2016-12-17 ENCOUNTER — Encounter: Payer: Self-pay | Admitting: Adult Health

## 2016-12-17 ENCOUNTER — Non-Acute Institutional Stay: Payer: Medicare Other | Admitting: Adult Health

## 2016-12-17 DIAGNOSIS — G301 Alzheimer's disease with late onset: Secondary | ICD-10-CM | POA: Diagnosis not present

## 2016-12-17 DIAGNOSIS — F3341 Major depressive disorder, recurrent, in partial remission: Secondary | ICD-10-CM | POA: Diagnosis not present

## 2016-12-17 DIAGNOSIS — M48061 Spinal stenosis, lumbar region without neurogenic claudication: Secondary | ICD-10-CM | POA: Diagnosis not present

## 2016-12-17 DIAGNOSIS — Z23 Encounter for immunization: Secondary | ICD-10-CM | POA: Diagnosis not present

## 2016-12-17 DIAGNOSIS — I725 Aneurysm of other precerebral arteries: Secondary | ICD-10-CM

## 2016-12-17 DIAGNOSIS — F0281 Dementia in other diseases classified elsewhere with behavioral disturbance: Secondary | ICD-10-CM

## 2016-12-17 DIAGNOSIS — R001 Bradycardia, unspecified: Secondary | ICD-10-CM

## 2016-12-17 DIAGNOSIS — E782 Mixed hyperlipidemia: Secondary | ICD-10-CM | POA: Diagnosis not present

## 2016-12-17 DIAGNOSIS — E559 Vitamin D deficiency, unspecified: Secondary | ICD-10-CM

## 2016-12-17 DIAGNOSIS — I10 Essential (primary) hypertension: Secondary | ICD-10-CM | POA: Diagnosis not present
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
## 2016-12-18 DIAGNOSIS — G218 Other secondary parkinsonism: Secondary | ICD-10-CM | POA: Diagnosis not present

## 2016-12-18 DIAGNOSIS — M6259 Muscle wasting and atrophy, not elsewhere classified, multiple sites: Secondary | ICD-10-CM | POA: Diagnosis not present

## 2016-12-18 DIAGNOSIS — R278 Other lack of coordination: Secondary | ICD-10-CM | POA: Diagnosis not present

## 2016-12-18 DIAGNOSIS — R293 Abnormal posture: Secondary | ICD-10-CM | POA: Diagnosis not present

## 2016-12-19 DIAGNOSIS — R509 Fever, unspecified: Secondary | ICD-10-CM | POA: Diagnosis not present

## 2016-12-20 ENCOUNTER — Non-Acute Institutional Stay: Payer: Medicare Other | Admitting: Adult Health

## 2016-12-20 ENCOUNTER — Encounter: Payer: Self-pay | Admitting: Adult Health

## 2016-12-20 DIAGNOSIS — M6259 Muscle wasting and atrophy, not elsewhere classified, multiple sites: Secondary | ICD-10-CM | POA: Diagnosis not present

## 2016-12-20 DIAGNOSIS — F02818 Dementia in other diseases classified elsewhere, unspecified severity, with other behavioral disturbance: Secondary | ICD-10-CM

## 2016-12-20 DIAGNOSIS — N390 Urinary tract infection, site not specified: Secondary | ICD-10-CM | POA: Diagnosis not present

## 2016-12-20 DIAGNOSIS — R509 Fever, unspecified: Secondary | ICD-10-CM

## 2016-12-20 DIAGNOSIS — R278 Other lack of coordination: Secondary | ICD-10-CM | POA: Diagnosis not present

## 2016-12-20 DIAGNOSIS — A014 Paratyphoid fever, unspecified: Secondary | ICD-10-CM | POA: Diagnosis not present

## 2016-12-20 DIAGNOSIS — Z79899 Other long term (current) drug therapy: Secondary | ICD-10-CM | POA: Diagnosis not present

## 2016-12-20 DIAGNOSIS — R293 Abnormal posture: Secondary | ICD-10-CM | POA: Diagnosis not present

## 2016-12-20 DIAGNOSIS — G301 Alzheimer's disease with late onset: Secondary | ICD-10-CM | POA: Diagnosis not present

## 2016-12-20 DIAGNOSIS — I10 Essential (primary) hypertension: Secondary | ICD-10-CM | POA: Diagnosis not present

## 2016-12-20 DIAGNOSIS — F0281 Dementia in other diseases classified elsewhere with behavioral disturbance: Secondary | ICD-10-CM | POA: Diagnosis not present

## 2016-12-20 DIAGNOSIS — R319 Hematuria, unspecified: Secondary | ICD-10-CM | POA: Diagnosis not present

## 2016-12-20 DIAGNOSIS — G218 Other secondary parkinsonism: Secondary | ICD-10-CM | POA: Diagnosis not present

## 2016-12-20 LAB — BASIC METABOLIC PANEL
BUN: 39 mg/dL — AB (ref 4–21)
Creatinine: 2.6 mg/dL — AB (ref 0.5–1.1)
GLUCOSE: 141 mg/dL
Potassium: 4.8 mmol/L (ref 3.4–5.3)
Sodium: 142 mmol/L (ref 137–147)

## 2016-12-20 LAB — HEPATIC FUNCTION PANEL
ALK PHOS: 90 U/L (ref 25–125)
ALT: 11 U/L (ref 7–35)
AST: 26 U/L (ref 13–35)
BILIRUBIN, TOTAL: 0.7 mg/dL

## 2016-12-20 LAB — CBC AND DIFFERENTIAL
HCT: 50 % — AB (ref 36–46)
HEMOGLOBIN: 16.2 g/dL — AB (ref 12.0–16.0)
Platelets: 128 10*3/uL — AB (ref 150–399)
WBC: 18.5 10*3/mL
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
## 2016-12-21 ENCOUNTER — Non-Acute Institutional Stay: Payer: Medicare Other | Admitting: Adult Health

## 2016-12-21 ENCOUNTER — Encounter: Payer: Self-pay | Admitting: Adult Health

## 2016-12-21 DIAGNOSIS — N3000 Acute cystitis without hematuria: Secondary | ICD-10-CM | POA: Diagnosis not present

## 2016-12-21 DIAGNOSIS — R000 Tachycardia, unspecified: Secondary | ICD-10-CM

## 2016-12-21 DIAGNOSIS — E860 Dehydration: Secondary | ICD-10-CM | POA: Diagnosis not present

## 2016-12-21 DIAGNOSIS — N179 Acute kidney failure, unspecified: Secondary | ICD-10-CM | POA: Diagnosis not present

## 2016-12-21 DIAGNOSIS — Z7189 Other specified counseling: Secondary | ICD-10-CM

## 2016-12-21 DIAGNOSIS — D72829 Elevated white blood cell count, unspecified: Secondary | ICD-10-CM | POA: Diagnosis not present
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
## 2016-12-23 DIAGNOSIS — R630 Anorexia: Secondary | ICD-10-CM | POA: Diagnosis not present

## 2016-12-23 DIAGNOSIS — J309 Allergic rhinitis, unspecified: Secondary | ICD-10-CM | POA: Diagnosis not present

## 2016-12-23 DIAGNOSIS — C50919 Malignant neoplasm of unspecified site of unspecified female breast: Secondary | ICD-10-CM | POA: Diagnosis not present

## 2016-12-23 DIAGNOSIS — G2581 Restless legs syndrome: Secondary | ICD-10-CM | POA: Diagnosis not present

## 2016-12-23 DIAGNOSIS — N390 Urinary tract infection, site not specified: Secondary | ICD-10-CM | POA: Diagnosis not present

## 2016-12-23 DIAGNOSIS — N179 Acute kidney failure, unspecified: Secondary | ICD-10-CM | POA: Diagnosis not present

## 2016-12-23 DIAGNOSIS — E785 Hyperlipidemia, unspecified: Secondary | ICD-10-CM | POA: Diagnosis not present

## 2016-12-23 DIAGNOSIS — G309 Alzheimer's disease, unspecified: Secondary | ICD-10-CM | POA: Diagnosis not present

## 2016-12-23 DIAGNOSIS — F339 Major depressive disorder, recurrent, unspecified: Secondary | ICD-10-CM | POA: Diagnosis not present

## 2016-12-23 DIAGNOSIS — I10 Essential (primary) hypertension: Secondary | ICD-10-CM | POA: Diagnosis not present

## 2016-12-23 DIAGNOSIS — I495 Sick sinus syndrome: Secondary | ICD-10-CM | POA: Diagnosis not present

## 2016-12-24 DIAGNOSIS — N179 Acute kidney failure, unspecified: Secondary | ICD-10-CM | POA: Diagnosis not present

## 2016-12-24 DIAGNOSIS — R634 Abnormal weight loss: Secondary | ICD-10-CM | POA: Diagnosis not present

## 2016-12-24 DIAGNOSIS — E785 Hyperlipidemia, unspecified: Secondary | ICD-10-CM | POA: Diagnosis not present

## 2016-12-24 DIAGNOSIS — G309 Alzheimer's disease, unspecified: Secondary | ICD-10-CM | POA: Diagnosis not present

## 2016-12-24 DIAGNOSIS — N390 Urinary tract infection, site not specified: Secondary | ICD-10-CM | POA: Diagnosis not present

## 2016-12-24 DIAGNOSIS — I495 Sick sinus syndrome: Secondary | ICD-10-CM | POA: Diagnosis not present

## 2016-12-24 DIAGNOSIS — I10 Essential (primary) hypertension: Secondary | ICD-10-CM | POA: Diagnosis not present

## 2016-12-24 LAB — BASIC METABOLIC PANEL
BUN: 32 mg/dL — AB (ref 4–21)
Creatinine: 1 mg/dL (ref 0.5–1.1)
Glucose: 145 mg/dL
Potassium: 3.7 mmol/L (ref 3.4–5.3)
Sodium: 147 mmol/L (ref 137–147)

## 2016-12-25 DIAGNOSIS — G309 Alzheimer's disease, unspecified: Secondary | ICD-10-CM | POA: Diagnosis not present

## 2016-12-25 DIAGNOSIS — I495 Sick sinus syndrome: Secondary | ICD-10-CM | POA: Diagnosis not present

## 2016-12-25 DIAGNOSIS — N390 Urinary tract infection, site not specified: Secondary | ICD-10-CM | POA: Diagnosis not present

## 2016-12-25 DIAGNOSIS — I10 Essential (primary) hypertension: Secondary | ICD-10-CM | POA: Diagnosis not present

## 2016-12-25 DIAGNOSIS — E785 Hyperlipidemia, unspecified: Secondary | ICD-10-CM | POA: Diagnosis not present

## 2016-12-25 DIAGNOSIS — N179 Acute kidney failure, unspecified: Secondary | ICD-10-CM | POA: Diagnosis not present

## 2016-12-26 DIAGNOSIS — G309 Alzheimer's disease, unspecified: Secondary | ICD-10-CM | POA: Diagnosis not present

## 2016-12-26 DIAGNOSIS — N179 Acute kidney failure, unspecified: Secondary | ICD-10-CM | POA: Diagnosis not present

## 2016-12-26 DIAGNOSIS — I495 Sick sinus syndrome: Secondary | ICD-10-CM | POA: Diagnosis not present

## 2016-12-26 DIAGNOSIS — N390 Urinary tract infection, site not specified: Secondary | ICD-10-CM | POA: Diagnosis not present

## 2016-12-26 DIAGNOSIS — I10 Essential (primary) hypertension: Secondary | ICD-10-CM | POA: Diagnosis not present

## 2016-12-26 DIAGNOSIS — E785 Hyperlipidemia, unspecified: Secondary | ICD-10-CM | POA: Diagnosis not present

## 2016-12-27 DIAGNOSIS — N179 Acute kidney failure, unspecified: Secondary | ICD-10-CM | POA: Diagnosis not present

## 2016-12-27 DIAGNOSIS — J309 Allergic rhinitis, unspecified: Secondary | ICD-10-CM | POA: Diagnosis not present

## 2016-12-27 DIAGNOSIS — G2581 Restless legs syndrome: Secondary | ICD-10-CM | POA: Diagnosis not present

## 2016-12-27 DIAGNOSIS — E785 Hyperlipidemia, unspecified: Secondary | ICD-10-CM | POA: Diagnosis not present

## 2016-12-27 DIAGNOSIS — R630 Anorexia: Secondary | ICD-10-CM | POA: Diagnosis not present

## 2016-12-27 DIAGNOSIS — F339 Major depressive disorder, recurrent, unspecified: Secondary | ICD-10-CM | POA: Diagnosis not present

## 2016-12-27 DIAGNOSIS — N390 Urinary tract infection, site not specified: Secondary | ICD-10-CM | POA: Diagnosis not present

## 2016-12-27 DIAGNOSIS — C50919 Malignant neoplasm of unspecified site of unspecified female breast: Secondary | ICD-10-CM | POA: Diagnosis not present

## 2016-12-27 DIAGNOSIS — I495 Sick sinus syndrome: Secondary | ICD-10-CM | POA: Diagnosis not present

## 2016-12-27 DIAGNOSIS — G309 Alzheimer's disease, unspecified: Secondary | ICD-10-CM | POA: Diagnosis not present

## 2016-12-27 DIAGNOSIS — I10 Essential (primary) hypertension: Secondary | ICD-10-CM | POA: Diagnosis not present

## 2017-01-03 DIAGNOSIS — I10 Essential (primary) hypertension: Secondary | ICD-10-CM | POA: Diagnosis not present

## 2017-01-03 DIAGNOSIS — N179 Acute kidney failure, unspecified: Secondary | ICD-10-CM | POA: Diagnosis not present

## 2017-01-03 DIAGNOSIS — I495 Sick sinus syndrome: Secondary | ICD-10-CM | POA: Diagnosis not present

## 2017-01-03 DIAGNOSIS — E785 Hyperlipidemia, unspecified: Secondary | ICD-10-CM | POA: Diagnosis not present

## 2017-01-03 DIAGNOSIS — G309 Alzheimer's disease, unspecified: Secondary | ICD-10-CM | POA: Diagnosis not present

## 2017-01-03 DIAGNOSIS — N390 Urinary tract infection, site not specified: Secondary | ICD-10-CM | POA: Diagnosis not present

## 2017-01-04 DIAGNOSIS — I10 Essential (primary) hypertension: Secondary | ICD-10-CM | POA: Diagnosis not present

## 2017-01-04 DIAGNOSIS — I495 Sick sinus syndrome: Secondary | ICD-10-CM | POA: Diagnosis not present

## 2017-01-04 DIAGNOSIS — N390 Urinary tract infection, site not specified: Secondary | ICD-10-CM | POA: Diagnosis not present

## 2017-01-04 DIAGNOSIS — G309 Alzheimer's disease, unspecified: Secondary | ICD-10-CM | POA: Diagnosis not present

## 2017-01-04 DIAGNOSIS — E785 Hyperlipidemia, unspecified: Secondary | ICD-10-CM | POA: Diagnosis not present

## 2017-01-04 DIAGNOSIS — N179 Acute kidney failure, unspecified: Secondary | ICD-10-CM | POA: Diagnosis not present

## 2017-01-09 DIAGNOSIS — N390 Urinary tract infection, site not specified: Secondary | ICD-10-CM | POA: Diagnosis not present

## 2017-01-09 DIAGNOSIS — I495 Sick sinus syndrome: Secondary | ICD-10-CM | POA: Diagnosis not present

## 2017-01-09 DIAGNOSIS — G309 Alzheimer's disease, unspecified: Secondary | ICD-10-CM | POA: Diagnosis not present

## 2017-01-09 DIAGNOSIS — E785 Hyperlipidemia, unspecified: Secondary | ICD-10-CM | POA: Diagnosis not present

## 2017-01-09 DIAGNOSIS — N179 Acute kidney failure, unspecified: Secondary | ICD-10-CM | POA: Diagnosis not present

## 2017-01-09 DIAGNOSIS — I10 Essential (primary) hypertension: Secondary | ICD-10-CM | POA: Diagnosis not present

## 2017-01-10 DIAGNOSIS — G309 Alzheimer's disease, unspecified: Secondary | ICD-10-CM | POA: Diagnosis not present

## 2017-01-10 DIAGNOSIS — I10 Essential (primary) hypertension: Secondary | ICD-10-CM | POA: Diagnosis not present

## 2017-01-10 DIAGNOSIS — N179 Acute kidney failure, unspecified: Secondary | ICD-10-CM | POA: Diagnosis not present

## 2017-01-10 DIAGNOSIS — I495 Sick sinus syndrome: Secondary | ICD-10-CM | POA: Diagnosis not present

## 2017-01-10 DIAGNOSIS — E785 Hyperlipidemia, unspecified: Secondary | ICD-10-CM | POA: Diagnosis not present

## 2017-01-10 DIAGNOSIS — N390 Urinary tract infection, site not specified: Secondary | ICD-10-CM | POA: Diagnosis not present

## 2017-01-17 DIAGNOSIS — N179 Acute kidney failure, unspecified: Secondary | ICD-10-CM | POA: Diagnosis not present

## 2017-01-17 DIAGNOSIS — I495 Sick sinus syndrome: Secondary | ICD-10-CM | POA: Diagnosis not present

## 2017-01-17 DIAGNOSIS — E785 Hyperlipidemia, unspecified: Secondary | ICD-10-CM | POA: Diagnosis not present

## 2017-01-17 DIAGNOSIS — G309 Alzheimer's disease, unspecified: Secondary | ICD-10-CM | POA: Diagnosis not present

## 2017-01-17 DIAGNOSIS — I10 Essential (primary) hypertension: Secondary | ICD-10-CM | POA: Diagnosis not present

## 2017-01-17 DIAGNOSIS — N390 Urinary tract infection, site not specified: Secondary | ICD-10-CM | POA: Diagnosis not present

## 2017-01-22 DIAGNOSIS — I495 Sick sinus syndrome: Secondary | ICD-10-CM | POA: Diagnosis not present

## 2017-01-22 DIAGNOSIS — G309 Alzheimer's disease, unspecified: Secondary | ICD-10-CM | POA: Diagnosis not present

## 2017-01-22 DIAGNOSIS — N390 Urinary tract infection, site not specified: Secondary | ICD-10-CM | POA: Diagnosis not present

## 2017-01-22 DIAGNOSIS — N179 Acute kidney failure, unspecified: Secondary | ICD-10-CM | POA: Diagnosis not present

## 2017-01-22 DIAGNOSIS — I10 Essential (primary) hypertension: Secondary | ICD-10-CM | POA: Diagnosis not present

## 2017-01-22 DIAGNOSIS — E785 Hyperlipidemia, unspecified: Secondary | ICD-10-CM | POA: Diagnosis not present

## 2017-01-23 ENCOUNTER — Encounter: Payer: Self-pay | Admitting: Internal Medicine

## 2017-01-23 ENCOUNTER — Non-Acute Institutional Stay: Payer: Medicare Other | Admitting: Internal Medicine

## 2017-01-23 DIAGNOSIS — G301 Alzheimer's disease with late onset: Secondary | ICD-10-CM

## 2017-01-23 DIAGNOSIS — M48061 Spinal stenosis, lumbar region without neurogenic claudication: Secondary | ICD-10-CM

## 2017-01-23 DIAGNOSIS — F0281 Dementia in other diseases classified elsewhere with behavioral disturbance: Secondary | ICD-10-CM | POA: Diagnosis not present

## 2017-01-23 DIAGNOSIS — I10 Essential (primary) hypertension: Secondary | ICD-10-CM | POA: Diagnosis not present

## 2017-01-23 DIAGNOSIS — R627 Adult failure to thrive: Secondary | ICD-10-CM

## 2017-01-23 DIAGNOSIS — N179 Acute kidney failure, unspecified: Secondary | ICD-10-CM | POA: Diagnosis not present

## 2017-01-23 DIAGNOSIS — F02818 Alzheimer's disease with late onset: Secondary | ICD-10-CM

## 2017-01-23 DIAGNOSIS — N3000 Acute cystitis without hematuria: Secondary | ICD-10-CM | POA: Diagnosis not present
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
## 2017-01-24 DIAGNOSIS — R630 Anorexia: Secondary | ICD-10-CM | POA: Diagnosis not present

## 2017-01-24 DIAGNOSIS — C50919 Malignant neoplasm of unspecified site of unspecified female breast: Secondary | ICD-10-CM | POA: Diagnosis not present

## 2017-01-24 DIAGNOSIS — F339 Major depressive disorder, recurrent, unspecified: Secondary | ICD-10-CM | POA: Diagnosis not present

## 2017-01-24 DIAGNOSIS — E785 Hyperlipidemia, unspecified: Secondary | ICD-10-CM | POA: Diagnosis not present

## 2017-01-24 DIAGNOSIS — G309 Alzheimer's disease, unspecified: Secondary | ICD-10-CM | POA: Diagnosis not present

## 2017-01-24 DIAGNOSIS — I495 Sick sinus syndrome: Secondary | ICD-10-CM | POA: Diagnosis not present

## 2017-01-24 DIAGNOSIS — N179 Acute kidney failure, unspecified: Secondary | ICD-10-CM | POA: Diagnosis not present

## 2017-01-24 DIAGNOSIS — I10 Essential (primary) hypertension: Secondary | ICD-10-CM | POA: Diagnosis not present

## 2017-01-24 DIAGNOSIS — N390 Urinary tract infection, site not specified: Secondary | ICD-10-CM | POA: Diagnosis not present

## 2017-01-24 DIAGNOSIS — J309 Allergic rhinitis, unspecified: Secondary | ICD-10-CM | POA: Diagnosis not present

## 2017-01-24 DIAGNOSIS — G2581 Restless legs syndrome: Secondary | ICD-10-CM | POA: Diagnosis not present

## 2017-01-25 DIAGNOSIS — I10 Essential (primary) hypertension: Secondary | ICD-10-CM | POA: Diagnosis not present

## 2017-01-25 DIAGNOSIS — N179 Acute kidney failure, unspecified: Secondary | ICD-10-CM | POA: Diagnosis not present

## 2017-01-25 DIAGNOSIS — G309 Alzheimer's disease, unspecified: Secondary | ICD-10-CM | POA: Diagnosis not present

## 2017-01-25 DIAGNOSIS — N390 Urinary tract infection, site not specified: Secondary | ICD-10-CM | POA: Diagnosis not present

## 2017-01-25 DIAGNOSIS — E785 Hyperlipidemia, unspecified: Secondary | ICD-10-CM | POA: Diagnosis not present

## 2017-01-25 DIAGNOSIS — I495 Sick sinus syndrome: Secondary | ICD-10-CM | POA: Diagnosis not present

## 2017-01-31 DIAGNOSIS — I495 Sick sinus syndrome: Secondary | ICD-10-CM | POA: Diagnosis not present

## 2017-01-31 DIAGNOSIS — E785 Hyperlipidemia, unspecified: Secondary | ICD-10-CM | POA: Diagnosis not present

## 2017-01-31 DIAGNOSIS — N179 Acute kidney failure, unspecified: Secondary | ICD-10-CM | POA: Diagnosis not present

## 2017-01-31 DIAGNOSIS — G309 Alzheimer's disease, unspecified: Secondary | ICD-10-CM | POA: Diagnosis not present

## 2017-01-31 DIAGNOSIS — N390 Urinary tract infection, site not specified: Secondary | ICD-10-CM | POA: Diagnosis not present

## 2017-01-31 DIAGNOSIS — I10 Essential (primary) hypertension: Secondary | ICD-10-CM | POA: Diagnosis not present

## 2017-02-05 DIAGNOSIS — N390 Urinary tract infection, site not specified: Secondary | ICD-10-CM | POA: Diagnosis not present

## 2017-02-05 DIAGNOSIS — I495 Sick sinus syndrome: Secondary | ICD-10-CM | POA: Diagnosis not present

## 2017-02-05 DIAGNOSIS — I10 Essential (primary) hypertension: Secondary | ICD-10-CM | POA: Diagnosis not present

## 2017-02-05 DIAGNOSIS — N179 Acute kidney failure, unspecified: Secondary | ICD-10-CM | POA: Diagnosis not present

## 2017-02-05 DIAGNOSIS — E785 Hyperlipidemia, unspecified: Secondary | ICD-10-CM | POA: Diagnosis not present

## 2017-02-05 DIAGNOSIS — G309 Alzheimer's disease, unspecified: Secondary | ICD-10-CM | POA: Diagnosis not present

## 2017-02-08 DIAGNOSIS — N179 Acute kidney failure, unspecified: Secondary | ICD-10-CM | POA: Diagnosis not present

## 2017-02-08 DIAGNOSIS — I495 Sick sinus syndrome: Secondary | ICD-10-CM | POA: Diagnosis not present

## 2017-02-08 DIAGNOSIS — I10 Essential (primary) hypertension: Secondary | ICD-10-CM | POA: Diagnosis not present

## 2017-02-08 DIAGNOSIS — N390 Urinary tract infection, site not specified: Secondary | ICD-10-CM | POA: Diagnosis not present

## 2017-02-08 DIAGNOSIS — G309 Alzheimer's disease, unspecified: Secondary | ICD-10-CM | POA: Diagnosis not present

## 2017-02-08 DIAGNOSIS — E785 Hyperlipidemia, unspecified: Secondary | ICD-10-CM | POA: Diagnosis not present

## 2017-02-13 DIAGNOSIS — G309 Alzheimer's disease, unspecified: Secondary | ICD-10-CM | POA: Diagnosis not present

## 2017-02-13 DIAGNOSIS — I10 Essential (primary) hypertension: Secondary | ICD-10-CM | POA: Diagnosis not present

## 2017-02-13 DIAGNOSIS — N179 Acute kidney failure, unspecified: Secondary | ICD-10-CM | POA: Diagnosis not present

## 2017-02-13 DIAGNOSIS — N390 Urinary tract infection, site not specified: Secondary | ICD-10-CM | POA: Diagnosis not present

## 2017-02-13 DIAGNOSIS — I495 Sick sinus syndrome: Secondary | ICD-10-CM | POA: Diagnosis not present

## 2017-02-13 DIAGNOSIS — E785 Hyperlipidemia, unspecified: Secondary | ICD-10-CM | POA: Diagnosis not present

## 2017-02-15 DIAGNOSIS — G309 Alzheimer's disease, unspecified: Secondary | ICD-10-CM | POA: Diagnosis not present

## 2017-02-15 DIAGNOSIS — N179 Acute kidney failure, unspecified: Secondary | ICD-10-CM | POA: Diagnosis not present

## 2017-02-15 DIAGNOSIS — I10 Essential (primary) hypertension: Secondary | ICD-10-CM | POA: Diagnosis not present

## 2017-02-15 DIAGNOSIS — I495 Sick sinus syndrome: Secondary | ICD-10-CM | POA: Diagnosis not present

## 2017-02-15 DIAGNOSIS — E785 Hyperlipidemia, unspecified: Secondary | ICD-10-CM | POA: Diagnosis not present

## 2017-02-15 DIAGNOSIS — N390 Urinary tract infection, site not specified: Secondary | ICD-10-CM | POA: Diagnosis not present

## 2017-02-17 DIAGNOSIS — E785 Hyperlipidemia, unspecified: Secondary | ICD-10-CM | POA: Diagnosis not present

## 2017-02-17 DIAGNOSIS — N179 Acute kidney failure, unspecified: Secondary | ICD-10-CM | POA: Diagnosis not present

## 2017-02-17 DIAGNOSIS — I495 Sick sinus syndrome: Secondary | ICD-10-CM | POA: Diagnosis not present

## 2017-02-17 DIAGNOSIS — N390 Urinary tract infection, site not specified: Secondary | ICD-10-CM | POA: Diagnosis not present

## 2017-02-17 DIAGNOSIS — G309 Alzheimer's disease, unspecified: Secondary | ICD-10-CM | POA: Diagnosis not present

## 2017-02-17 DIAGNOSIS — I10 Essential (primary) hypertension: Secondary | ICD-10-CM | POA: Diagnosis not present

## 2017-02-19 DIAGNOSIS — I495 Sick sinus syndrome: Secondary | ICD-10-CM | POA: Diagnosis not present

## 2017-02-19 DIAGNOSIS — E785 Hyperlipidemia, unspecified: Secondary | ICD-10-CM | POA: Diagnosis not present

## 2017-02-19 DIAGNOSIS — N179 Acute kidney failure, unspecified: Secondary | ICD-10-CM | POA: Diagnosis not present

## 2017-02-19 DIAGNOSIS — G309 Alzheimer's disease, unspecified: Secondary | ICD-10-CM | POA: Diagnosis not present

## 2017-02-19 DIAGNOSIS — N390 Urinary tract infection, site not specified: Secondary | ICD-10-CM | POA: Diagnosis not present

## 2017-02-19 DIAGNOSIS — I10 Essential (primary) hypertension: Secondary | ICD-10-CM | POA: Diagnosis not present

## 2017-02-20 DIAGNOSIS — N179 Acute kidney failure, unspecified: Secondary | ICD-10-CM | POA: Diagnosis not present

## 2017-02-20 DIAGNOSIS — I10 Essential (primary) hypertension: Secondary | ICD-10-CM | POA: Diagnosis not present

## 2017-02-20 DIAGNOSIS — I495 Sick sinus syndrome: Secondary | ICD-10-CM | POA: Diagnosis not present

## 2017-02-20 DIAGNOSIS — E785 Hyperlipidemia, unspecified: Secondary | ICD-10-CM | POA: Diagnosis not present

## 2017-02-20 DIAGNOSIS — N390 Urinary tract infection, site not specified: Secondary | ICD-10-CM | POA: Diagnosis not present

## 2017-02-20 DIAGNOSIS — G309 Alzheimer's disease, unspecified: Secondary | ICD-10-CM | POA: Diagnosis not present

## 2017-02-24 DIAGNOSIS — J309 Allergic rhinitis, unspecified: Secondary | ICD-10-CM | POA: Diagnosis not present

## 2017-02-24 DIAGNOSIS — F339 Major depressive disorder, recurrent, unspecified: Secondary | ICD-10-CM | POA: Diagnosis not present

## 2017-02-24 DIAGNOSIS — I10 Essential (primary) hypertension: Secondary | ICD-10-CM | POA: Diagnosis not present

## 2017-02-24 DIAGNOSIS — N179 Acute kidney failure, unspecified: Secondary | ICD-10-CM | POA: Diagnosis not present

## 2017-02-24 DIAGNOSIS — C50919 Malignant neoplasm of unspecified site of unspecified female breast: Secondary | ICD-10-CM | POA: Diagnosis not present

## 2017-02-24 DIAGNOSIS — G309 Alzheimer's disease, unspecified: Secondary | ICD-10-CM | POA: Diagnosis not present

## 2017-02-24 DIAGNOSIS — N390 Urinary tract infection, site not specified: Secondary | ICD-10-CM | POA: Diagnosis not present

## 2017-02-24 DIAGNOSIS — R630 Anorexia: Secondary | ICD-10-CM | POA: Diagnosis not present

## 2017-02-24 DIAGNOSIS — I495 Sick sinus syndrome: Secondary | ICD-10-CM | POA: Diagnosis not present

## 2017-02-24 DIAGNOSIS — E785 Hyperlipidemia, unspecified: Secondary | ICD-10-CM | POA: Diagnosis not present

## 2017-02-24 DIAGNOSIS — G2581 Restless legs syndrome: Secondary | ICD-10-CM | POA: Diagnosis not present

## 2017-02-26 DIAGNOSIS — N179 Acute kidney failure, unspecified: Secondary | ICD-10-CM | POA: Diagnosis not present

## 2017-02-26 DIAGNOSIS — I495 Sick sinus syndrome: Secondary | ICD-10-CM | POA: Diagnosis not present

## 2017-02-26 DIAGNOSIS — N390 Urinary tract infection, site not specified: Secondary | ICD-10-CM | POA: Diagnosis not present

## 2017-02-26 DIAGNOSIS — E785 Hyperlipidemia, unspecified: Secondary | ICD-10-CM | POA: Diagnosis not present

## 2017-02-26 DIAGNOSIS — G309 Alzheimer's disease, unspecified: Secondary | ICD-10-CM | POA: Diagnosis not present

## 2017-02-26 DIAGNOSIS — I10 Essential (primary) hypertension: Secondary | ICD-10-CM | POA: Diagnosis not present

## 2017-03-01 DIAGNOSIS — I495 Sick sinus syndrome: Secondary | ICD-10-CM | POA: Diagnosis not present

## 2017-03-01 DIAGNOSIS — I10 Essential (primary) hypertension: Secondary | ICD-10-CM | POA: Diagnosis not present

## 2017-03-01 DIAGNOSIS — N179 Acute kidney failure, unspecified: Secondary | ICD-10-CM | POA: Diagnosis not present

## 2017-03-01 DIAGNOSIS — N390 Urinary tract infection, site not specified: Secondary | ICD-10-CM | POA: Diagnosis not present

## 2017-03-01 DIAGNOSIS — G309 Alzheimer's disease, unspecified: Secondary | ICD-10-CM | POA: Diagnosis not present

## 2017-03-01 DIAGNOSIS — E785 Hyperlipidemia, unspecified: Secondary | ICD-10-CM | POA: Diagnosis not present

## 2017-03-04 DIAGNOSIS — I10 Essential (primary) hypertension: Secondary | ICD-10-CM | POA: Diagnosis not present

## 2017-03-04 DIAGNOSIS — N179 Acute kidney failure, unspecified: Secondary | ICD-10-CM | POA: Diagnosis not present

## 2017-03-04 DIAGNOSIS — N390 Urinary tract infection, site not specified: Secondary | ICD-10-CM | POA: Diagnosis not present

## 2017-03-04 DIAGNOSIS — E785 Hyperlipidemia, unspecified: Secondary | ICD-10-CM | POA: Diagnosis not present

## 2017-03-04 DIAGNOSIS — G309 Alzheimer's disease, unspecified: Secondary | ICD-10-CM | POA: Diagnosis not present

## 2017-03-04 DIAGNOSIS — I495 Sick sinus syndrome: Secondary | ICD-10-CM | POA: Diagnosis not present

## 2017-03-06 DIAGNOSIS — N179 Acute kidney failure, unspecified: Secondary | ICD-10-CM | POA: Diagnosis not present

## 2017-03-06 DIAGNOSIS — G309 Alzheimer's disease, unspecified: Secondary | ICD-10-CM | POA: Diagnosis not present

## 2017-03-06 DIAGNOSIS — I495 Sick sinus syndrome: Secondary | ICD-10-CM | POA: Diagnosis not present

## 2017-03-06 DIAGNOSIS — I10 Essential (primary) hypertension: Secondary | ICD-10-CM | POA: Diagnosis not present

## 2017-03-06 DIAGNOSIS — E785 Hyperlipidemia, unspecified: Secondary | ICD-10-CM | POA: Diagnosis not present

## 2017-03-06 DIAGNOSIS — N390 Urinary tract infection, site not specified: Secondary | ICD-10-CM | POA: Diagnosis not present

## 2017-03-07 ENCOUNTER — Non-Acute Institutional Stay: Payer: Medicare Other | Admitting: Adult Health

## 2017-03-07 ENCOUNTER — Encounter: Payer: Self-pay | Admitting: Adult Health

## 2017-03-07 DIAGNOSIS — N179 Acute kidney failure, unspecified: Secondary | ICD-10-CM | POA: Diagnosis not present

## 2017-03-07 DIAGNOSIS — R634 Abnormal weight loss: Secondary | ICD-10-CM | POA: Diagnosis not present

## 2017-03-07 DIAGNOSIS — R627 Adult failure to thrive: Secondary | ICD-10-CM | POA: Diagnosis not present

## 2017-03-07 DIAGNOSIS — I10 Essential (primary) hypertension: Secondary | ICD-10-CM

## 2017-03-07 DIAGNOSIS — R001 Bradycardia, unspecified: Secondary | ICD-10-CM | POA: Diagnosis not present

## 2017-03-07 DIAGNOSIS — F0281 Dementia in other diseases classified elsewhere with behavioral disturbance: Secondary | ICD-10-CM

## 2017-03-07 DIAGNOSIS — G301 Alzheimer's disease with late onset: Secondary | ICD-10-CM | POA: Diagnosis not present

## 2017-03-07 DIAGNOSIS — R1312 Dysphagia, oropharyngeal phase: Secondary | ICD-10-CM | POA: Diagnosis not present

## 2017-03-07 DIAGNOSIS — F3341 Major depressive disorder, recurrent, in partial remission: Secondary | ICD-10-CM | POA: Diagnosis not present

## 2017-03-07 DIAGNOSIS — E785 Hyperlipidemia, unspecified: Secondary | ICD-10-CM | POA: Diagnosis not present

## 2017-03-07 DIAGNOSIS — I495 Sick sinus syndrome: Secondary | ICD-10-CM | POA: Diagnosis not present

## 2017-03-07 DIAGNOSIS — G309 Alzheimer's disease, unspecified: Secondary | ICD-10-CM | POA: Diagnosis not present

## 2017-03-07 DIAGNOSIS — R1310 Dysphagia, unspecified: Secondary | ICD-10-CM | POA: Insufficient documentation

## 2017-03-07 DIAGNOSIS — F02818 Dementia in other diseases classified elsewhere, unspecified severity, with other behavioral disturbance: Secondary | ICD-10-CM

## 2017-03-07 DIAGNOSIS — N390 Urinary tract infection, site not specified: Secondary | ICD-10-CM | POA: Diagnosis not present
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
## 2017-03-09 DIAGNOSIS — I10 Essential (primary) hypertension: Secondary | ICD-10-CM | POA: Diagnosis not present

## 2017-03-09 DIAGNOSIS — E785 Hyperlipidemia, unspecified: Secondary | ICD-10-CM | POA: Diagnosis not present

## 2017-03-09 DIAGNOSIS — N390 Urinary tract infection, site not specified: Secondary | ICD-10-CM | POA: Diagnosis not present

## 2017-03-09 DIAGNOSIS — N179 Acute kidney failure, unspecified: Secondary | ICD-10-CM | POA: Diagnosis not present

## 2017-03-09 DIAGNOSIS — I495 Sick sinus syndrome: Secondary | ICD-10-CM | POA: Diagnosis not present

## 2017-03-09 DIAGNOSIS — G309 Alzheimer's disease, unspecified: Secondary | ICD-10-CM | POA: Diagnosis not present

## 2017-03-11 DIAGNOSIS — E785 Hyperlipidemia, unspecified: Secondary | ICD-10-CM | POA: Diagnosis not present

## 2017-03-11 DIAGNOSIS — N390 Urinary tract infection, site not specified: Secondary | ICD-10-CM | POA: Diagnosis not present

## 2017-03-11 DIAGNOSIS — I495 Sick sinus syndrome: Secondary | ICD-10-CM | POA: Diagnosis not present

## 2017-03-11 DIAGNOSIS — G309 Alzheimer's disease, unspecified: Secondary | ICD-10-CM | POA: Diagnosis not present

## 2017-03-11 DIAGNOSIS — N179 Acute kidney failure, unspecified: Secondary | ICD-10-CM | POA: Diagnosis not present

## 2017-03-11 DIAGNOSIS — I10 Essential (primary) hypertension: Secondary | ICD-10-CM | POA: Diagnosis not present

## 2017-03-14 DIAGNOSIS — N390 Urinary tract infection, site not specified: Secondary | ICD-10-CM | POA: Diagnosis not present

## 2017-03-14 DIAGNOSIS — N179 Acute kidney failure, unspecified: Secondary | ICD-10-CM | POA: Diagnosis not present

## 2017-03-14 DIAGNOSIS — I495 Sick sinus syndrome: Secondary | ICD-10-CM | POA: Diagnosis not present

## 2017-03-14 DIAGNOSIS — I10 Essential (primary) hypertension: Secondary | ICD-10-CM | POA: Diagnosis not present

## 2017-03-14 DIAGNOSIS — E785 Hyperlipidemia, unspecified: Secondary | ICD-10-CM | POA: Diagnosis not present

## 2017-03-14 DIAGNOSIS — G309 Alzheimer's disease, unspecified: Secondary | ICD-10-CM | POA: Diagnosis not present

## 2017-03-19 DIAGNOSIS — I495 Sick sinus syndrome: Secondary | ICD-10-CM | POA: Diagnosis not present

## 2017-03-19 DIAGNOSIS — N390 Urinary tract infection, site not specified: Secondary | ICD-10-CM | POA: Diagnosis not present

## 2017-03-19 DIAGNOSIS — E785 Hyperlipidemia, unspecified: Secondary | ICD-10-CM | POA: Diagnosis not present

## 2017-03-19 DIAGNOSIS — G309 Alzheimer's disease, unspecified: Secondary | ICD-10-CM | POA: Diagnosis not present

## 2017-03-19 DIAGNOSIS — I10 Essential (primary) hypertension: Secondary | ICD-10-CM | POA: Diagnosis not present

## 2017-03-19 DIAGNOSIS — N179 Acute kidney failure, unspecified: Secondary | ICD-10-CM | POA: Diagnosis not present

## 2017-03-26 DIAGNOSIS — R630 Anorexia: Secondary | ICD-10-CM | POA: Diagnosis not present

## 2017-03-26 DIAGNOSIS — E785 Hyperlipidemia, unspecified: Secondary | ICD-10-CM | POA: Diagnosis not present

## 2017-03-26 DIAGNOSIS — N390 Urinary tract infection, site not specified: Secondary | ICD-10-CM | POA: Diagnosis not present

## 2017-03-26 DIAGNOSIS — C50919 Malignant neoplasm of unspecified site of unspecified female breast: Secondary | ICD-10-CM | POA: Diagnosis not present

## 2017-03-26 DIAGNOSIS — J309 Allergic rhinitis, unspecified: Secondary | ICD-10-CM | POA: Diagnosis not present

## 2017-03-26 DIAGNOSIS — I495 Sick sinus syndrome: Secondary | ICD-10-CM | POA: Diagnosis not present

## 2017-03-26 DIAGNOSIS — I10 Essential (primary) hypertension: Secondary | ICD-10-CM | POA: Diagnosis not present

## 2017-03-26 DIAGNOSIS — N179 Acute kidney failure, unspecified: Secondary | ICD-10-CM | POA: Diagnosis not present

## 2017-03-26 DIAGNOSIS — G309 Alzheimer's disease, unspecified: Secondary | ICD-10-CM | POA: Diagnosis not present

## 2017-03-26 DIAGNOSIS — G2581 Restless legs syndrome: Secondary | ICD-10-CM | POA: Diagnosis not present

## 2017-03-26 DIAGNOSIS — F339 Major depressive disorder, recurrent, unspecified: Secondary | ICD-10-CM | POA: Diagnosis not present

## 2017-03-28 DIAGNOSIS — E785 Hyperlipidemia, unspecified: Secondary | ICD-10-CM | POA: Diagnosis not present

## 2017-03-28 DIAGNOSIS — N390 Urinary tract infection, site not specified: Secondary | ICD-10-CM | POA: Diagnosis not present

## 2017-03-28 DIAGNOSIS — I10 Essential (primary) hypertension: Secondary | ICD-10-CM | POA: Diagnosis not present

## 2017-03-28 DIAGNOSIS — I495 Sick sinus syndrome: Secondary | ICD-10-CM | POA: Diagnosis not present

## 2017-03-28 DIAGNOSIS — G309 Alzheimer's disease, unspecified: Secondary | ICD-10-CM | POA: Diagnosis not present

## 2017-03-28 DIAGNOSIS — N179 Acute kidney failure, unspecified: Secondary | ICD-10-CM | POA: Diagnosis not present

## 2017-04-01 DIAGNOSIS — N179 Acute kidney failure, unspecified: Secondary | ICD-10-CM | POA: Diagnosis not present

## 2017-04-01 DIAGNOSIS — I10 Essential (primary) hypertension: Secondary | ICD-10-CM | POA: Diagnosis not present

## 2017-04-01 DIAGNOSIS — N390 Urinary tract infection, site not specified: Secondary | ICD-10-CM | POA: Diagnosis not present

## 2017-04-01 DIAGNOSIS — E785 Hyperlipidemia, unspecified: Secondary | ICD-10-CM | POA: Diagnosis not present

## 2017-04-01 DIAGNOSIS — G309 Alzheimer's disease, unspecified: Secondary | ICD-10-CM | POA: Diagnosis not present

## 2017-04-01 DIAGNOSIS — I495 Sick sinus syndrome: Secondary | ICD-10-CM | POA: Diagnosis not present

## 2017-04-02 ENCOUNTER — Encounter: Payer: Self-pay | Admitting: Internal Medicine

## 2017-04-02 ENCOUNTER — Non-Acute Institutional Stay: Payer: Medicare Other | Admitting: Internal Medicine

## 2017-04-02 DIAGNOSIS — F0281 Dementia in other diseases classified elsewhere with behavioral disturbance: Secondary | ICD-10-CM | POA: Diagnosis not present

## 2017-04-02 DIAGNOSIS — R627 Adult failure to thrive: Secondary | ICD-10-CM

## 2017-04-02 DIAGNOSIS — R1312 Dysphagia, oropharyngeal phase: Secondary | ICD-10-CM

## 2017-04-02 DIAGNOSIS — R634 Abnormal weight loss: Secondary | ICD-10-CM | POA: Diagnosis not present

## 2017-04-02 DIAGNOSIS — F3341 Major depressive disorder, recurrent, in partial remission: Secondary | ICD-10-CM

## 2017-04-02 DIAGNOSIS — G301 Alzheimer's disease with late onset: Secondary | ICD-10-CM | POA: Diagnosis not present

## 2017-04-02 DIAGNOSIS — M48061 Spinal stenosis, lumbar region without neurogenic claudication: Secondary | ICD-10-CM | POA: Diagnosis not present

## 2017-04-02 DIAGNOSIS — F02818 Alzheimer's disease with late onset: Secondary | ICD-10-CM

## 2017-04-02 IMAGING — CT CT HEAD W/O CM
2 series · 15 of 30 positions shown, 17 images · non-contrast
Comparison: None.

CLINICAL DATA: Code stroke. Right-sided weakness and slurred
speech.

EXAM:
CT HEAD WITHOUT CONTRAST
TECHNIQUE: Contiguous axial images were obtained from the base of the skull
through the vertex without intravenous contrast.

[Series 2: head without · axial · non-contrast · 0.42mm/px · z∈[-200,-80]mm · 7 of 32 slices shown, 9 images]
[im 4/32  brain]
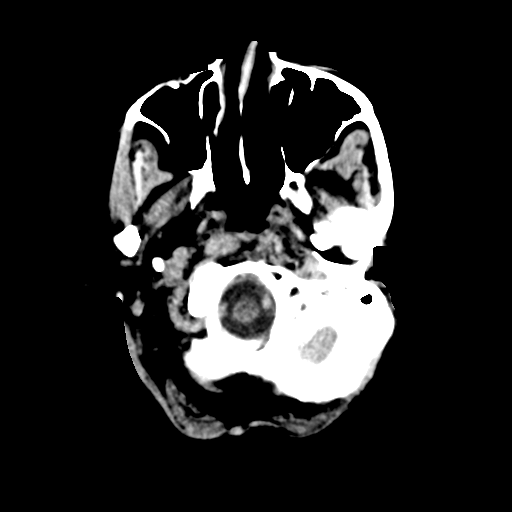
[im 4/32  bone]
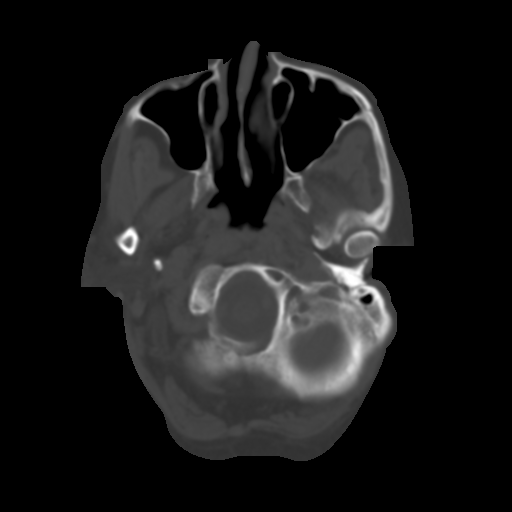
[im 8/32  brain]
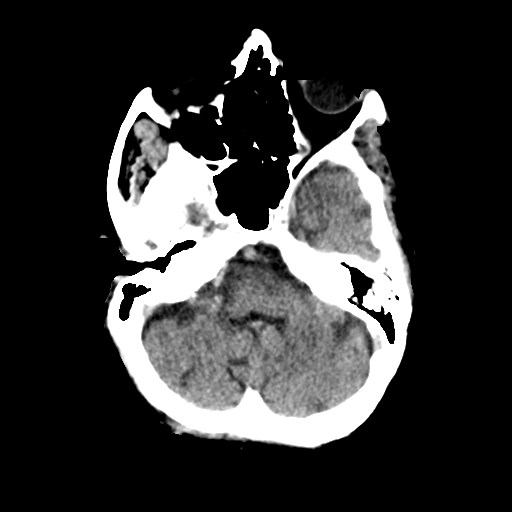
[im 12/32  brain]
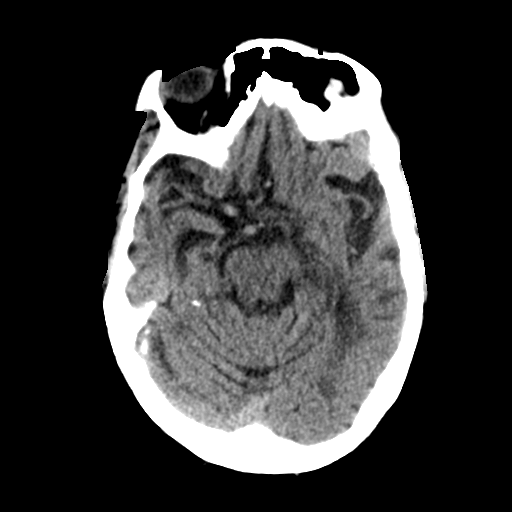
[im 16/32  brain]
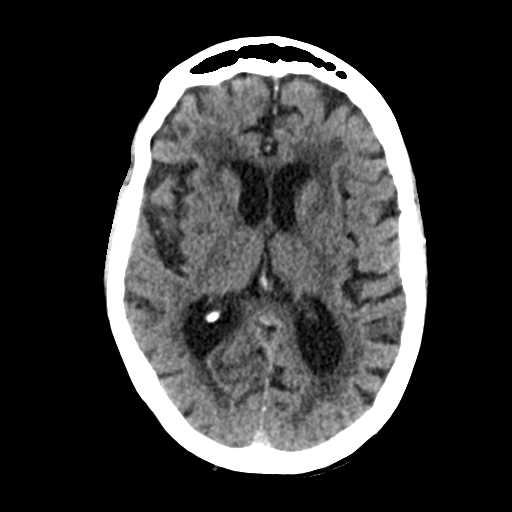
[im 20/32  brain]
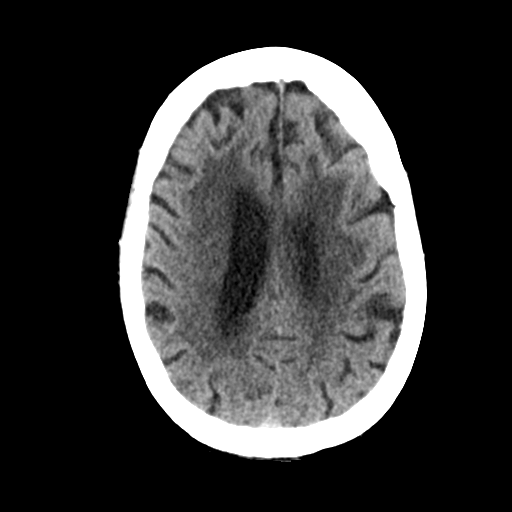
[im 20/32  bone]
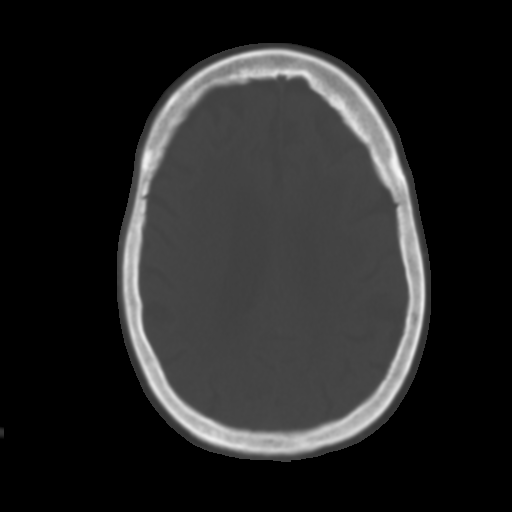
[im 24/32  brain]
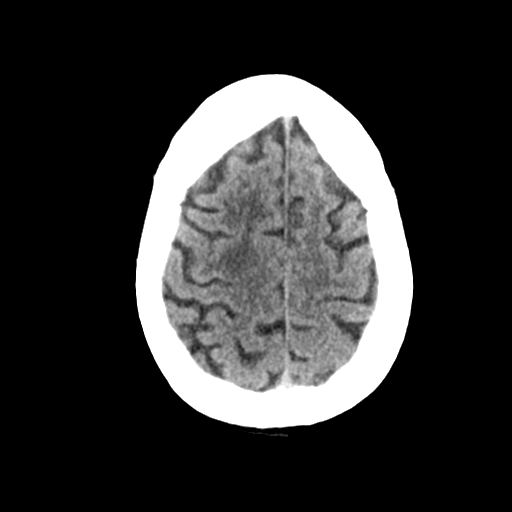
[im 28/32  brain]
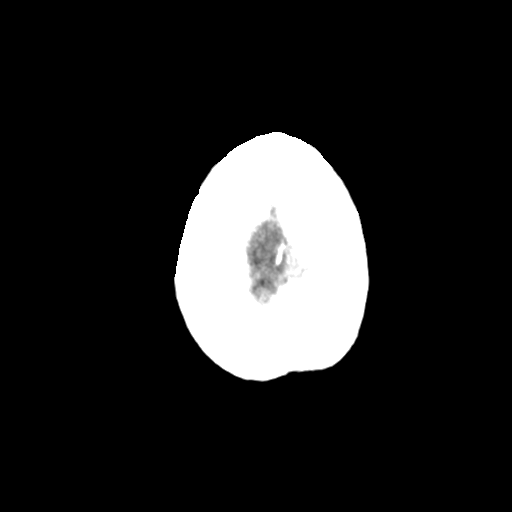

[Series 3: head bone · axial · 0.42mm/px · z∈[-202,-74]mm · 8 of 80 slices shown]
[im 8/80  bone]
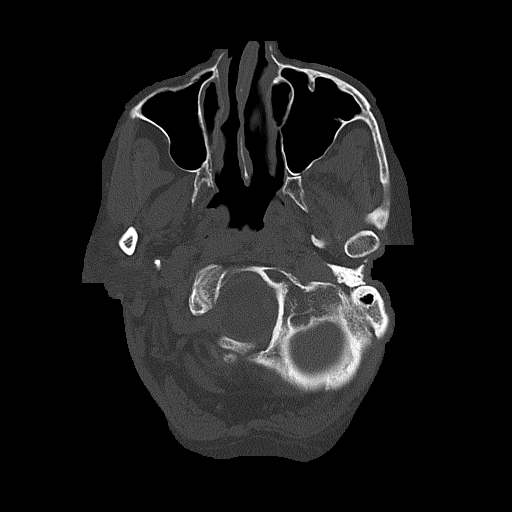
[im 16/80  bone]
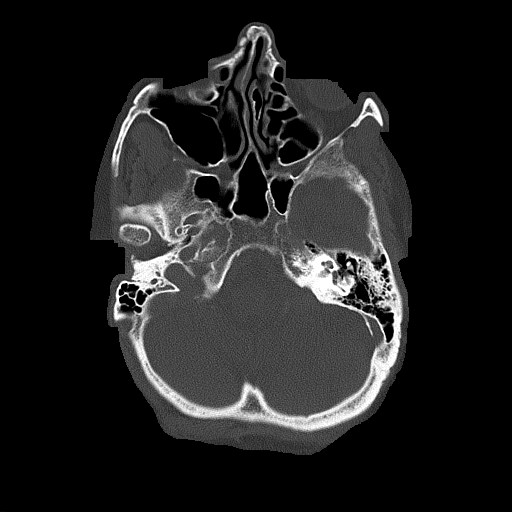
[im 24/80  bone]
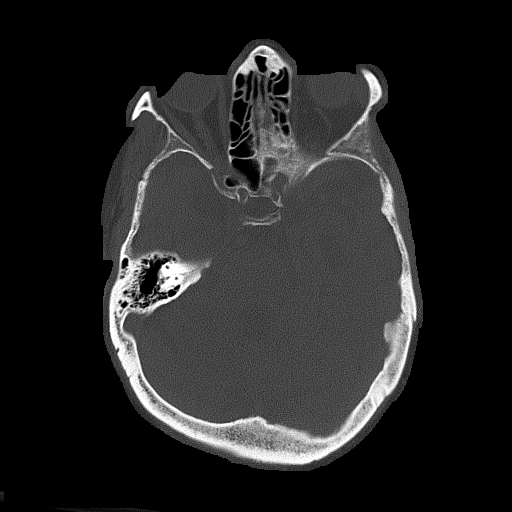
[im 36/80  bone]
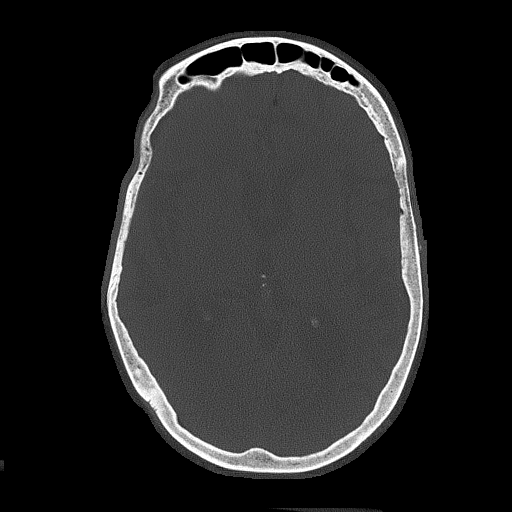
[im 44/80  bone]
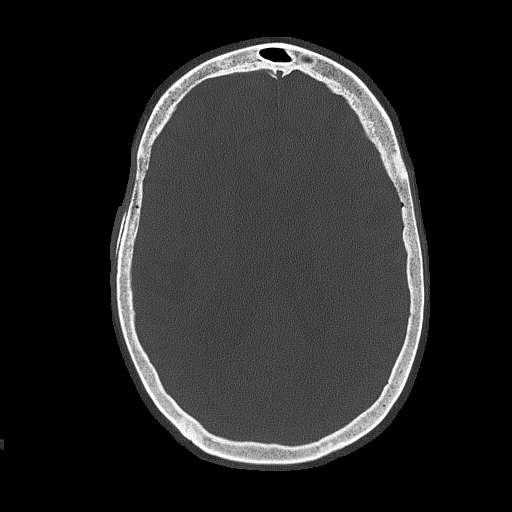
[im 56/80  bone]
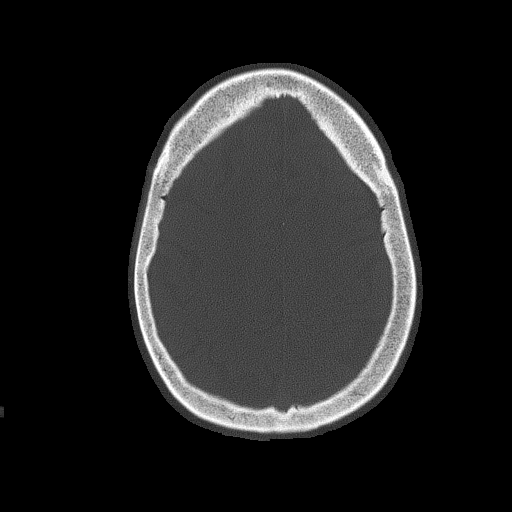
[im 64/80  bone]
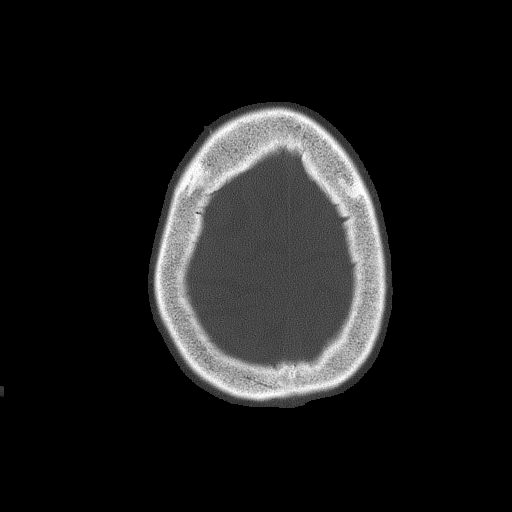
[im 72/80  bone]
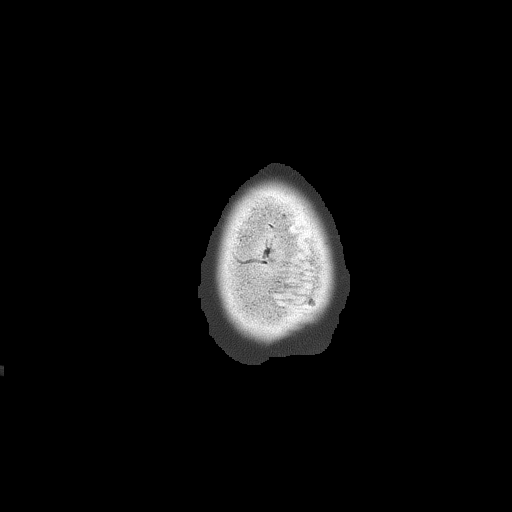

[15 of 30 positions shown; findings below may reference images not displayed]

FINDINGS: Generalized atrophy and chronic small vessel ischemia. Slightly more
confluent ill-defined hypodensity and obscuration of gray-white
differentiation in the periventricular left frontal lobe. No
intracranial hemorrhage. No intracranial or extra-axial fluid
collection. No hydrocephalus. Basal cisterns are patent. The
calvarium is intact. Included paranasal sinuses and mastoid air
cells are well aerated.
IMPRESSION: 1. Subtle ill-defined hypodensity within the left periventricular
white matter, concerning for acute ischemia. There are no prior
exams available for comparison.
2. Background atrophy and chronic small vessel ischemic change.

These results were called by telephone at the time of interpretation
on 12/01/2015 at [DATE] to Dr. Miura , who verbally acknowledged
these results.
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
## 2017-04-03 DIAGNOSIS — E785 Hyperlipidemia, unspecified: Secondary | ICD-10-CM | POA: Diagnosis not present

## 2017-04-03 DIAGNOSIS — N390 Urinary tract infection, site not specified: Secondary | ICD-10-CM | POA: Diagnosis not present

## 2017-04-03 DIAGNOSIS — I495 Sick sinus syndrome: Secondary | ICD-10-CM | POA: Diagnosis not present

## 2017-04-03 DIAGNOSIS — G309 Alzheimer's disease, unspecified: Secondary | ICD-10-CM | POA: Diagnosis not present

## 2017-04-03 DIAGNOSIS — N179 Acute kidney failure, unspecified: Secondary | ICD-10-CM | POA: Diagnosis not present

## 2017-04-03 DIAGNOSIS — I10 Essential (primary) hypertension: Secondary | ICD-10-CM | POA: Diagnosis not present

## 2017-04-06 DIAGNOSIS — I495 Sick sinus syndrome: Secondary | ICD-10-CM | POA: Diagnosis not present

## 2017-04-06 DIAGNOSIS — G309 Alzheimer's disease, unspecified: Secondary | ICD-10-CM | POA: Diagnosis not present

## 2017-04-06 DIAGNOSIS — E785 Hyperlipidemia, unspecified: Secondary | ICD-10-CM | POA: Diagnosis not present

## 2017-04-06 DIAGNOSIS — N390 Urinary tract infection, site not specified: Secondary | ICD-10-CM | POA: Diagnosis not present

## 2017-04-06 DIAGNOSIS — N179 Acute kidney failure, unspecified: Secondary | ICD-10-CM | POA: Diagnosis not present

## 2017-04-06 DIAGNOSIS — I10 Essential (primary) hypertension: Secondary | ICD-10-CM | POA: Diagnosis not present

## 2017-04-10 DIAGNOSIS — G309 Alzheimer's disease, unspecified: Secondary | ICD-10-CM | POA: Diagnosis not present

## 2017-04-10 DIAGNOSIS — I495 Sick sinus syndrome: Secondary | ICD-10-CM | POA: Diagnosis not present

## 2017-04-10 DIAGNOSIS — I10 Essential (primary) hypertension: Secondary | ICD-10-CM | POA: Diagnosis not present

## 2017-04-10 DIAGNOSIS — N179 Acute kidney failure, unspecified: Secondary | ICD-10-CM | POA: Diagnosis not present

## 2017-04-10 DIAGNOSIS — E785 Hyperlipidemia, unspecified: Secondary | ICD-10-CM | POA: Diagnosis not present

## 2017-04-10 DIAGNOSIS — N390 Urinary tract infection, site not specified: Secondary | ICD-10-CM | POA: Diagnosis not present

## 2017-04-12 DIAGNOSIS — I10 Essential (primary) hypertension: Secondary | ICD-10-CM | POA: Diagnosis not present

## 2017-04-12 DIAGNOSIS — I495 Sick sinus syndrome: Secondary | ICD-10-CM | POA: Diagnosis not present

## 2017-04-12 DIAGNOSIS — E785 Hyperlipidemia, unspecified: Secondary | ICD-10-CM | POA: Diagnosis not present

## 2017-04-12 DIAGNOSIS — N179 Acute kidney failure, unspecified: Secondary | ICD-10-CM | POA: Diagnosis not present

## 2017-04-12 DIAGNOSIS — G309 Alzheimer's disease, unspecified: Secondary | ICD-10-CM | POA: Diagnosis not present

## 2017-04-12 DIAGNOSIS — N390 Urinary tract infection, site not specified: Secondary | ICD-10-CM | POA: Diagnosis not present

## 2017-04-17 DIAGNOSIS — E785 Hyperlipidemia, unspecified: Secondary | ICD-10-CM | POA: Diagnosis not present

## 2017-04-17 DIAGNOSIS — I495 Sick sinus syndrome: Secondary | ICD-10-CM | POA: Diagnosis not present

## 2017-04-17 DIAGNOSIS — N390 Urinary tract infection, site not specified: Secondary | ICD-10-CM | POA: Diagnosis not present

## 2017-04-17 DIAGNOSIS — N179 Acute kidney failure, unspecified: Secondary | ICD-10-CM | POA: Diagnosis not present

## 2017-04-17 DIAGNOSIS — I10 Essential (primary) hypertension: Secondary | ICD-10-CM | POA: Diagnosis not present

## 2017-04-17 DIAGNOSIS — G309 Alzheimer's disease, unspecified: Secondary | ICD-10-CM | POA: Diagnosis not present

## 2017-04-18 DIAGNOSIS — N179 Acute kidney failure, unspecified: Secondary | ICD-10-CM | POA: Diagnosis not present

## 2017-04-18 DIAGNOSIS — G309 Alzheimer's disease, unspecified: Secondary | ICD-10-CM | POA: Diagnosis not present

## 2017-04-18 DIAGNOSIS — I495 Sick sinus syndrome: Secondary | ICD-10-CM | POA: Diagnosis not present

## 2017-04-18 DIAGNOSIS — E785 Hyperlipidemia, unspecified: Secondary | ICD-10-CM | POA: Diagnosis not present

## 2017-04-18 DIAGNOSIS — I10 Essential (primary) hypertension: Secondary | ICD-10-CM | POA: Diagnosis not present

## 2017-04-18 DIAGNOSIS — N390 Urinary tract infection, site not specified: Secondary | ICD-10-CM | POA: Diagnosis not present

## 2017-04-26 DIAGNOSIS — N179 Acute kidney failure, unspecified: Secondary | ICD-10-CM | POA: Diagnosis not present

## 2017-04-26 DIAGNOSIS — C50919 Malignant neoplasm of unspecified site of unspecified female breast: Secondary | ICD-10-CM | POA: Diagnosis not present

## 2017-04-26 DIAGNOSIS — I495 Sick sinus syndrome: Secondary | ICD-10-CM | POA: Diagnosis not present

## 2017-04-26 DIAGNOSIS — F339 Major depressive disorder, recurrent, unspecified: Secondary | ICD-10-CM | POA: Diagnosis not present

## 2017-04-26 DIAGNOSIS — G2581 Restless legs syndrome: Secondary | ICD-10-CM | POA: Diagnosis not present

## 2017-04-26 DIAGNOSIS — R630 Anorexia: Secondary | ICD-10-CM | POA: Diagnosis not present

## 2017-04-26 DIAGNOSIS — I10 Essential (primary) hypertension: Secondary | ICD-10-CM | POA: Diagnosis not present

## 2017-04-26 DIAGNOSIS — G309 Alzheimer's disease, unspecified: Secondary | ICD-10-CM | POA: Diagnosis not present

## 2017-04-26 DIAGNOSIS — N390 Urinary tract infection, site not specified: Secondary | ICD-10-CM | POA: Diagnosis not present

## 2017-04-26 DIAGNOSIS — J309 Allergic rhinitis, unspecified: Secondary | ICD-10-CM | POA: Diagnosis not present

## 2017-04-26 DIAGNOSIS — E785 Hyperlipidemia, unspecified: Secondary | ICD-10-CM | POA: Diagnosis not present

## 2017-05-01 DIAGNOSIS — G309 Alzheimer's disease, unspecified: Secondary | ICD-10-CM | POA: Diagnosis not present

## 2017-05-01 DIAGNOSIS — N390 Urinary tract infection, site not specified: Secondary | ICD-10-CM | POA: Diagnosis not present

## 2017-05-01 DIAGNOSIS — E785 Hyperlipidemia, unspecified: Secondary | ICD-10-CM | POA: Diagnosis not present

## 2017-05-01 DIAGNOSIS — I495 Sick sinus syndrome: Secondary | ICD-10-CM | POA: Diagnosis not present

## 2017-05-01 DIAGNOSIS — N179 Acute kidney failure, unspecified: Secondary | ICD-10-CM | POA: Diagnosis not present

## 2017-05-01 DIAGNOSIS — I10 Essential (primary) hypertension: Secondary | ICD-10-CM | POA: Diagnosis not present

## 2017-05-06 DIAGNOSIS — N179 Acute kidney failure, unspecified: Secondary | ICD-10-CM | POA: Diagnosis not present

## 2017-05-06 DIAGNOSIS — E785 Hyperlipidemia, unspecified: Secondary | ICD-10-CM | POA: Diagnosis not present

## 2017-05-06 DIAGNOSIS — I495 Sick sinus syndrome: Secondary | ICD-10-CM | POA: Diagnosis not present

## 2017-05-06 DIAGNOSIS — I10 Essential (primary) hypertension: Secondary | ICD-10-CM | POA: Diagnosis not present

## 2017-05-06 DIAGNOSIS — G309 Alzheimer's disease, unspecified: Secondary | ICD-10-CM | POA: Diagnosis not present

## 2017-05-06 DIAGNOSIS — N390 Urinary tract infection, site not specified: Secondary | ICD-10-CM | POA: Diagnosis not present

## 2017-05-09 DIAGNOSIS — G309 Alzheimer's disease, unspecified: Secondary | ICD-10-CM | POA: Diagnosis not present

## 2017-05-09 DIAGNOSIS — I495 Sick sinus syndrome: Secondary | ICD-10-CM | POA: Diagnosis not present

## 2017-05-09 DIAGNOSIS — N179 Acute kidney failure, unspecified: Secondary | ICD-10-CM | POA: Diagnosis not present

## 2017-05-09 DIAGNOSIS — N390 Urinary tract infection, site not specified: Secondary | ICD-10-CM | POA: Diagnosis not present

## 2017-05-09 DIAGNOSIS — E785 Hyperlipidemia, unspecified: Secondary | ICD-10-CM | POA: Diagnosis not present

## 2017-05-09 DIAGNOSIS — I10 Essential (primary) hypertension: Secondary | ICD-10-CM | POA: Diagnosis not present

## 2017-05-14 DIAGNOSIS — N179 Acute kidney failure, unspecified: Secondary | ICD-10-CM | POA: Diagnosis not present

## 2017-05-14 DIAGNOSIS — I495 Sick sinus syndrome: Secondary | ICD-10-CM | POA: Diagnosis not present

## 2017-05-14 DIAGNOSIS — N390 Urinary tract infection, site not specified: Secondary | ICD-10-CM | POA: Diagnosis not present

## 2017-05-14 DIAGNOSIS — G309 Alzheimer's disease, unspecified: Secondary | ICD-10-CM | POA: Diagnosis not present

## 2017-05-14 DIAGNOSIS — E785 Hyperlipidemia, unspecified: Secondary | ICD-10-CM | POA: Diagnosis not present

## 2017-05-14 DIAGNOSIS — I10 Essential (primary) hypertension: Secondary | ICD-10-CM | POA: Diagnosis not present

## 2017-05-23 DIAGNOSIS — N390 Urinary tract infection, site not specified: Secondary | ICD-10-CM | POA: Diagnosis not present

## 2017-05-23 DIAGNOSIS — N179 Acute kidney failure, unspecified: Secondary | ICD-10-CM | POA: Diagnosis not present

## 2017-05-23 DIAGNOSIS — E785 Hyperlipidemia, unspecified: Secondary | ICD-10-CM | POA: Diagnosis not present

## 2017-05-23 DIAGNOSIS — I495 Sick sinus syndrome: Secondary | ICD-10-CM | POA: Diagnosis not present

## 2017-05-23 DIAGNOSIS — G309 Alzheimer's disease, unspecified: Secondary | ICD-10-CM | POA: Diagnosis not present

## 2017-05-23 DIAGNOSIS — I10 Essential (primary) hypertension: Secondary | ICD-10-CM | POA: Diagnosis not present

## 2017-05-26 DIAGNOSIS — C50919 Malignant neoplasm of unspecified site of unspecified female breast: Secondary | ICD-10-CM | POA: Diagnosis not present

## 2017-05-26 DIAGNOSIS — J309 Allergic rhinitis, unspecified: Secondary | ICD-10-CM | POA: Diagnosis not present

## 2017-05-26 DIAGNOSIS — F339 Major depressive disorder, recurrent, unspecified: Secondary | ICD-10-CM | POA: Diagnosis not present

## 2017-05-26 DIAGNOSIS — E785 Hyperlipidemia, unspecified: Secondary | ICD-10-CM | POA: Diagnosis not present

## 2017-05-26 DIAGNOSIS — R630 Anorexia: Secondary | ICD-10-CM | POA: Diagnosis not present

## 2017-05-26 DIAGNOSIS — N179 Acute kidney failure, unspecified: Secondary | ICD-10-CM | POA: Diagnosis not present

## 2017-05-26 DIAGNOSIS — I495 Sick sinus syndrome: Secondary | ICD-10-CM | POA: Diagnosis not present

## 2017-05-26 DIAGNOSIS — G309 Alzheimer's disease, unspecified: Secondary | ICD-10-CM | POA: Diagnosis not present

## 2017-05-26 DIAGNOSIS — I10 Essential (primary) hypertension: Secondary | ICD-10-CM | POA: Diagnosis not present

## 2017-05-26 DIAGNOSIS — N390 Urinary tract infection, site not specified: Secondary | ICD-10-CM | POA: Diagnosis not present

## 2017-05-26 DIAGNOSIS — G2581 Restless legs syndrome: Secondary | ICD-10-CM | POA: Diagnosis not present

## 2017-05-27 DIAGNOSIS — E785 Hyperlipidemia, unspecified: Secondary | ICD-10-CM | POA: Diagnosis not present

## 2017-05-27 DIAGNOSIS — N179 Acute kidney failure, unspecified: Secondary | ICD-10-CM | POA: Diagnosis not present

## 2017-05-27 DIAGNOSIS — G309 Alzheimer's disease, unspecified: Secondary | ICD-10-CM | POA: Diagnosis not present

## 2017-05-27 DIAGNOSIS — I10 Essential (primary) hypertension: Secondary | ICD-10-CM | POA: Diagnosis not present

## 2017-05-27 DIAGNOSIS — I495 Sick sinus syndrome: Secondary | ICD-10-CM | POA: Diagnosis not present

## 2017-05-27 DIAGNOSIS — N390 Urinary tract infection, site not specified: Secondary | ICD-10-CM | POA: Diagnosis not present

## 2017-05-28 DIAGNOSIS — G309 Alzheimer's disease, unspecified: Secondary | ICD-10-CM | POA: Diagnosis not present

## 2017-05-28 DIAGNOSIS — E785 Hyperlipidemia, unspecified: Secondary | ICD-10-CM | POA: Diagnosis not present

## 2017-05-28 DIAGNOSIS — N390 Urinary tract infection, site not specified: Secondary | ICD-10-CM | POA: Diagnosis not present

## 2017-05-28 DIAGNOSIS — I10 Essential (primary) hypertension: Secondary | ICD-10-CM | POA: Diagnosis not present

## 2017-05-28 DIAGNOSIS — I495 Sick sinus syndrome: Secondary | ICD-10-CM | POA: Diagnosis not present

## 2017-05-28 DIAGNOSIS — N179 Acute kidney failure, unspecified: Secondary | ICD-10-CM | POA: Diagnosis not present

## 2017-05-30 ENCOUNTER — Encounter: Payer: Self-pay | Admitting: Adult Health

## 2017-05-30 ENCOUNTER — Non-Acute Institutional Stay: Payer: Medicare Other | Admitting: Adult Health

## 2017-05-30 DIAGNOSIS — N390 Urinary tract infection, site not specified: Secondary | ICD-10-CM | POA: Diagnosis not present

## 2017-05-30 DIAGNOSIS — G309 Alzheimer's disease, unspecified: Secondary | ICD-10-CM | POA: Diagnosis not present

## 2017-05-30 DIAGNOSIS — I495 Sick sinus syndrome: Secondary | ICD-10-CM | POA: Diagnosis not present

## 2017-05-30 DIAGNOSIS — F0281 Dementia in other diseases classified elsewhere with behavioral disturbance: Secondary | ICD-10-CM

## 2017-05-30 DIAGNOSIS — K5901 Slow transit constipation: Secondary | ICD-10-CM | POA: Diagnosis not present

## 2017-05-30 DIAGNOSIS — G301 Alzheimer's disease with late onset: Secondary | ICD-10-CM

## 2017-05-30 DIAGNOSIS — I10 Essential (primary) hypertension: Secondary | ICD-10-CM

## 2017-05-30 DIAGNOSIS — N179 Acute kidney failure, unspecified: Secondary | ICD-10-CM | POA: Diagnosis not present

## 2017-05-30 DIAGNOSIS — F02818 Dementia in other diseases classified elsewhere, unspecified severity, with other behavioral disturbance: Secondary | ICD-10-CM

## 2017-05-30 DIAGNOSIS — R509 Fever, unspecified: Secondary | ICD-10-CM | POA: Diagnosis not present

## 2017-05-30 DIAGNOSIS — K5900 Constipation, unspecified: Secondary | ICD-10-CM | POA: Insufficient documentation

## 2017-05-30 DIAGNOSIS — M48061 Spinal stenosis, lumbar region without neurogenic claudication: Secondary | ICD-10-CM

## 2017-05-30 DIAGNOSIS — E785 Hyperlipidemia, unspecified: Secondary | ICD-10-CM | POA: Diagnosis not present

## 2017-05-30 DIAGNOSIS — R634 Abnormal weight loss: Secondary | ICD-10-CM | POA: Diagnosis not present
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
## 2017-05-31 DIAGNOSIS — I495 Sick sinus syndrome: Secondary | ICD-10-CM | POA: Diagnosis not present

## 2017-05-31 DIAGNOSIS — N390 Urinary tract infection, site not specified: Secondary | ICD-10-CM | POA: Diagnosis not present

## 2017-05-31 DIAGNOSIS — N179 Acute kidney failure, unspecified: Secondary | ICD-10-CM | POA: Diagnosis not present

## 2017-05-31 DIAGNOSIS — E785 Hyperlipidemia, unspecified: Secondary | ICD-10-CM | POA: Diagnosis not present

## 2017-05-31 DIAGNOSIS — I10 Essential (primary) hypertension: Secondary | ICD-10-CM | POA: Diagnosis not present

## 2017-05-31 DIAGNOSIS — G309 Alzheimer's disease, unspecified: Secondary | ICD-10-CM | POA: Diagnosis not present

## 2017-06-04 DIAGNOSIS — N179 Acute kidney failure, unspecified: Secondary | ICD-10-CM | POA: Diagnosis not present

## 2017-06-04 DIAGNOSIS — I495 Sick sinus syndrome: Secondary | ICD-10-CM | POA: Diagnosis not present

## 2017-06-04 DIAGNOSIS — E785 Hyperlipidemia, unspecified: Secondary | ICD-10-CM | POA: Diagnosis not present

## 2017-06-04 DIAGNOSIS — I10 Essential (primary) hypertension: Secondary | ICD-10-CM | POA: Diagnosis not present

## 2017-06-04 DIAGNOSIS — G309 Alzheimer's disease, unspecified: Secondary | ICD-10-CM | POA: Diagnosis not present

## 2017-06-04 DIAGNOSIS — N390 Urinary tract infection, site not specified: Secondary | ICD-10-CM | POA: Diagnosis not present

## 2017-06-05 DIAGNOSIS — I10 Essential (primary) hypertension: Secondary | ICD-10-CM | POA: Diagnosis not present

## 2017-06-05 DIAGNOSIS — I495 Sick sinus syndrome: Secondary | ICD-10-CM | POA: Diagnosis not present

## 2017-06-05 DIAGNOSIS — E785 Hyperlipidemia, unspecified: Secondary | ICD-10-CM | POA: Diagnosis not present

## 2017-06-05 DIAGNOSIS — G309 Alzheimer's disease, unspecified: Secondary | ICD-10-CM | POA: Diagnosis not present

## 2017-06-05 DIAGNOSIS — N179 Acute kidney failure, unspecified: Secondary | ICD-10-CM | POA: Diagnosis not present

## 2017-06-05 DIAGNOSIS — N390 Urinary tract infection, site not specified: Secondary | ICD-10-CM | POA: Diagnosis not present

## 2017-06-06 DIAGNOSIS — I495 Sick sinus syndrome: Secondary | ICD-10-CM | POA: Diagnosis not present

## 2017-06-06 DIAGNOSIS — E785 Hyperlipidemia, unspecified: Secondary | ICD-10-CM | POA: Diagnosis not present

## 2017-06-06 DIAGNOSIS — G309 Alzheimer's disease, unspecified: Secondary | ICD-10-CM | POA: Diagnosis not present

## 2017-06-06 DIAGNOSIS — N179 Acute kidney failure, unspecified: Secondary | ICD-10-CM | POA: Diagnosis not present

## 2017-06-06 DIAGNOSIS — N390 Urinary tract infection, site not specified: Secondary | ICD-10-CM | POA: Diagnosis not present

## 2017-06-06 DIAGNOSIS — I10 Essential (primary) hypertension: Secondary | ICD-10-CM | POA: Diagnosis not present

## 2017-06-07 DIAGNOSIS — E785 Hyperlipidemia, unspecified: Secondary | ICD-10-CM | POA: Diagnosis not present

## 2017-06-07 DIAGNOSIS — G309 Alzheimer's disease, unspecified: Secondary | ICD-10-CM | POA: Diagnosis not present

## 2017-06-07 DIAGNOSIS — I495 Sick sinus syndrome: Secondary | ICD-10-CM | POA: Diagnosis not present

## 2017-06-07 DIAGNOSIS — N179 Acute kidney failure, unspecified: Secondary | ICD-10-CM | POA: Diagnosis not present

## 2017-06-07 DIAGNOSIS — I10 Essential (primary) hypertension: Secondary | ICD-10-CM | POA: Diagnosis not present

## 2017-06-07 DIAGNOSIS — N390 Urinary tract infection, site not specified: Secondary | ICD-10-CM | POA: Diagnosis not present

## 2017-06-26 DEATH — deceased
